# Patient Record
Sex: Male | Born: 1975
Health system: Southern US, Community
[De-identification: ages and names within clinical notes are randomized; demographics above are authoritative.]

## PROBLEM LIST (undated history)

## (undated) DIAGNOSIS — B1910 Unspecified viral hepatitis B without hepatic coma: Secondary | ICD-10-CM

## (undated) HISTORY — PX: NO PAST SURGERIES: SHX2092

## (undated) HISTORY — DX: Unspecified viral hepatitis B without hepatic coma: B19.10

---

## 2001-04-09 ENCOUNTER — Emergency Department (HOSPITAL_COMMUNITY): Admission: EM | Admit: 2001-04-09 | Discharge: 2001-04-09 | Payer: Self-pay | Admitting: Emergency Medicine

## 2003-12-25 ENCOUNTER — Emergency Department (HOSPITAL_COMMUNITY): Admission: EM | Admit: 2003-12-25 | Discharge: 2003-12-25 | Payer: Self-pay | Admitting: Emergency Medicine

## 2006-09-30 ENCOUNTER — Emergency Department (HOSPITAL_COMMUNITY): Admission: EM | Admit: 2006-09-30 | Discharge: 2006-09-30 | Payer: Self-pay | Admitting: Emergency Medicine

## 2009-06-04 ENCOUNTER — Emergency Department (HOSPITAL_COMMUNITY): Admission: EM | Admit: 2009-06-04 | Discharge: 2009-06-04 | Payer: Self-pay | Admitting: Emergency Medicine

## 2009-06-06 ENCOUNTER — Emergency Department (HOSPITAL_COMMUNITY): Admission: EM | Admit: 2009-06-06 | Discharge: 2009-06-06 | Payer: Self-pay | Admitting: Emergency Medicine

## 2009-08-02 ENCOUNTER — Emergency Department (HOSPITAL_COMMUNITY): Admission: EM | Admit: 2009-08-02 | Discharge: 2009-08-02 | Payer: Self-pay | Admitting: Emergency Medicine

## 2009-08-18 ENCOUNTER — Ambulatory Visit: Payer: Self-pay | Admitting: Vascular Surgery

## 2009-08-18 ENCOUNTER — Encounter (INDEPENDENT_AMBULATORY_CARE_PROVIDER_SITE_OTHER): Payer: Self-pay | Admitting: Emergency Medicine

## 2009-08-18 ENCOUNTER — Emergency Department (HOSPITAL_COMMUNITY): Admission: EM | Admit: 2009-08-18 | Discharge: 2009-08-18 | Payer: Self-pay | Admitting: Family Medicine

## 2009-08-18 ENCOUNTER — Inpatient Hospital Stay (HOSPITAL_COMMUNITY): Admission: EM | Admit: 2009-08-18 | Discharge: 2009-08-25 | Payer: Self-pay | Admitting: Emergency Medicine

## 2009-08-18 ENCOUNTER — Encounter (INDEPENDENT_AMBULATORY_CARE_PROVIDER_SITE_OTHER): Payer: Self-pay | Admitting: Internal Medicine

## 2009-08-18 DIAGNOSIS — L03119 Cellulitis of unspecified part of limb: Secondary | ICD-10-CM

## 2009-08-18 DIAGNOSIS — L02419 Cutaneous abscess of limb, unspecified: Secondary | ICD-10-CM | POA: Insufficient documentation

## 2009-08-18 DIAGNOSIS — I82409 Acute embolism and thrombosis of unspecified deep veins of unspecified lower extremity: Secondary | ICD-10-CM | POA: Insufficient documentation

## 2009-08-23 ENCOUNTER — Ambulatory Visit: Payer: Self-pay | Admitting: Infectious Diseases

## 2009-08-25 DIAGNOSIS — D72829 Elevated white blood cell count, unspecified: Secondary | ICD-10-CM | POA: Insufficient documentation

## 2009-08-25 DIAGNOSIS — R7401 Elevation of levels of liver transaminase levels: Secondary | ICD-10-CM | POA: Insufficient documentation

## 2009-08-25 DIAGNOSIS — R740 Nonspecific elevation of levels of transaminase and lactic acid dehydrogenase [LDH]: Secondary | ICD-10-CM

## 2009-08-29 ENCOUNTER — Encounter: Payer: Self-pay | Admitting: Infectious Diseases

## 2009-09-03 ENCOUNTER — Encounter (INDEPENDENT_AMBULATORY_CARE_PROVIDER_SITE_OTHER): Payer: Self-pay | Admitting: *Deleted

## 2009-09-03 DIAGNOSIS — D353 Benign neoplasm of craniopharyngeal duct: Secondary | ICD-10-CM

## 2009-09-03 DIAGNOSIS — D352 Benign neoplasm of pituitary gland: Secondary | ICD-10-CM | POA: Insufficient documentation

## 2009-09-12 ENCOUNTER — Encounter: Payer: Self-pay | Admitting: Infectious Diseases

## 2010-06-17 NOTE — Consult Note (Signed)
Summary: [facility]  [facility]   Imported By: Florinda Marker 09/27/2009 16:59:25  _____________________________________________________________________  External Attachment:    Type:   Image     Comment:   External Document

## 2010-06-17 NOTE — Consult Note (Signed)
Summary: [facility]  [facility]   Imported By: Florinda Marker 09/24/2009 11:14:53  _____________________________________________________________________  External Attachment:    Type:   Image     Comment:   External Document

## 2010-06-17 NOTE — Miscellaneous (Signed)
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

## 2010-08-06 LAB — COMPREHENSIVE METABOLIC PANEL
ALT: 111 U/L — ABNORMAL HIGH (ref 0–53)
ALT: 142 U/L — ABNORMAL HIGH (ref 0–53)
ALT: 166 U/L — ABNORMAL HIGH (ref 0–53)
ALT: 181 U/L — ABNORMAL HIGH (ref 0–53)
ALT: 38 U/L (ref 0–53)
AST: 112 U/L — ABNORMAL HIGH (ref 0–37)
AST: 116 U/L — ABNORMAL HIGH (ref 0–37)
AST: 27 U/L (ref 0–37)
AST: 84 U/L — ABNORMAL HIGH (ref 0–37)
AST: 97 U/L — ABNORMAL HIGH (ref 0–37)
Albumin: 3 g/dL — ABNORMAL LOW (ref 3.5–5.2)
Albumin: 3.3 g/dL — ABNORMAL LOW (ref 3.5–5.2)
Albumin: 3.4 g/dL — ABNORMAL LOW (ref 3.5–5.2)
Albumin: 3.6 g/dL (ref 3.5–5.2)
Albumin: 3.6 g/dL (ref 3.5–5.2)
Alkaline Phosphatase: 110 U/L (ref 39–117)
Alkaline Phosphatase: 86 U/L (ref 39–117)
Alkaline Phosphatase: 88 U/L (ref 39–117)
Alkaline Phosphatase: 93 U/L (ref 39–117)
Alkaline Phosphatase: 96 U/L (ref 39–117)
BUN: 10 mg/dL (ref 6–23)
BUN: 10 mg/dL (ref 6–23)
BUN: 8 mg/dL (ref 6–23)
BUN: 9 mg/dL (ref 6–23)
BUN: 9 mg/dL (ref 6–23)
CO2: 29 mEq/L (ref 19–32)
CO2: 29 mEq/L (ref 19–32)
CO2: 30 mEq/L (ref 19–32)
CO2: 31 mEq/L (ref 19–32)
CO2: 31 mEq/L (ref 19–32)
Calcium: 9.2 mg/dL (ref 8.4–10.5)
Calcium: 9.2 mg/dL (ref 8.4–10.5)
Calcium: 9.2 mg/dL (ref 8.4–10.5)
Calcium: 9.3 mg/dL (ref 8.4–10.5)
Calcium: 9.4 mg/dL (ref 8.4–10.5)
Chloride: 100 mEq/L (ref 96–112)
Chloride: 102 mEq/L (ref 96–112)
Chloride: 103 mEq/L (ref 96–112)
Chloride: 103 mEq/L (ref 96–112)
Chloride: 105 mEq/L (ref 96–112)
Creatinine, Ser: 0.97 mg/dL (ref 0.4–1.5)
Creatinine, Ser: 0.97 mg/dL (ref 0.4–1.5)
Creatinine, Ser: 1.04 mg/dL (ref 0.4–1.5)
Creatinine, Ser: 1.04 mg/dL (ref 0.4–1.5)
Creatinine, Ser: 1.09 mg/dL (ref 0.4–1.5)
GFR calc Af Amer: >60 mL/min (ref >60)
GFR calc Af Amer: >60 mL/min (ref >60)
GFR calc Af Amer: >60 mL/min (ref >60)
GFR calc Af Amer: >60 mL/min (ref >60)
GFR calc Af Amer: >60 mL/min (ref >60)
GFR calc non Af Amer: >60 mL/min (ref >60)
GFR calc non Af Amer: >60 mL/min (ref >60)
GFR calc non Af Amer: >60 mL/min (ref >60)
GFR calc non Af Amer: >60 mL/min (ref >60)
GFR calc non Af Amer: >60 mL/min (ref >60)
Glucose, Bld: 136 mg/dL — ABNORMAL HIGH (ref 70–99)
Glucose, Bld: 85 mg/dL (ref 70–99)
Glucose, Bld: 85 mg/dL (ref 70–99)
Glucose, Bld: 86 mg/dL (ref 70–99)
Glucose, Bld: 88 mg/dL (ref 70–99)
Potassium: 3.9 mEq/L (ref 3.5–5.1)
Potassium: 4.4 mEq/L (ref 3.5–5.1)
Potassium: 4.5 mEq/L (ref 3.5–5.1)
Potassium: 4.6 mEq/L (ref 3.5–5.1)
Potassium: 5.1 mEq/L (ref 3.5–5.1)
Sodium: 135 mEq/L (ref 135–145)
Sodium: 137 mEq/L (ref 135–145)
Sodium: 138 mEq/L (ref 135–145)
Sodium: 139 mEq/L (ref 135–145)
Sodium: 140 mEq/L (ref 135–145)
Total Bilirubin: 0.3 mg/dL (ref 0.3–1.2)
Total Bilirubin: 0.4 mg/dL (ref 0.3–1.2)
Total Bilirubin: 0.4 mg/dL (ref 0.3–1.2)
Total Bilirubin: 0.5 mg/dL (ref 0.3–1.2)
Total Bilirubin: 0.7 mg/dL (ref 0.3–1.2)
Total Protein: 7 g/dL (ref 6.0–8.3)
Total Protein: 7.2 g/dL (ref 6.0–8.3)
Total Protein: 7.3 g/dL (ref 6.0–8.3)
Total Protein: 7.3 g/dL (ref 6.0–8.3)
Total Protein: 7.5 g/dL (ref 6.0–8.3)

## 2010-08-06 LAB — BODY FLUID CULTURE: Culture: NO GROWTH

## 2010-08-06 LAB — BASIC METABOLIC PANEL
BUN: 9 mg/dL (ref 6–23)
BUN: 9 mg/dL (ref 6–23)
CO2: 30 mEq/L (ref 19–32)
CO2: 32 mEq/L (ref 19–32)
Calcium: 9.2 mg/dL (ref 8.4–10.5)
Calcium: 9.3 mg/dL (ref 8.4–10.5)
Chloride: 101 mEq/L (ref 96–112)
Chloride: 102 mEq/L (ref 96–112)
Creatinine, Ser: 0.96 mg/dL (ref 0.4–1.5)
Creatinine, Ser: 1.03 mg/dL (ref 0.4–1.5)
GFR calc Af Amer: >60 mL/min (ref >60)
GFR calc Af Amer: >60 mL/min (ref >60)
GFR calc non Af Amer: >60 mL/min (ref >60)
GFR calc non Af Amer: >60 mL/min (ref >60)
Glucose, Bld: 80 mg/dL (ref 70–99)
Glucose, Bld: 95 mg/dL (ref 70–99)
Potassium: 4.4 mEq/L (ref 3.5–5.1)
Potassium: 4.5 mEq/L (ref 3.5–5.1)
Sodium: 139 mEq/L (ref 135–145)
Sodium: 141 mEq/L (ref 135–145)

## 2010-08-06 LAB — CBC
HCT: 40.1 % (ref 39.0–52.0)
HCT: 40.4 % (ref 39.0–52.0)
HCT: 41.1 % (ref 39.0–52.0)
HCT: 42.7 % (ref 39.0–52.0)
HCT: 42.7 % (ref 39.0–52.0)
HCT: 42.9 % (ref 39.0–52.0)
HCT: 43.3 % (ref 39.0–52.0)
HCT: 43.4 % (ref 39.0–52.0)
Hemoglobin: 13.3 g/dL (ref 13.0–17.0)
Hemoglobin: 13.4 g/dL (ref 13.0–17.0)
Hemoglobin: 13.4 g/dL (ref 13.0–17.0)
Hemoglobin: 14.1 g/dL (ref 13.0–17.0)
Hemoglobin: 14.2 g/dL (ref 13.0–17.0)
Hemoglobin: 14.2 g/dL (ref 13.0–17.0)
Hemoglobin: 14.3 g/dL (ref 13.0–17.0)
Hemoglobin: 14.5 g/dL (ref 13.0–17.0)
MCHC: 32.6 g/dL (ref 30.0–36.0)
MCHC: 32.8 g/dL (ref 30.0–36.0)
MCHC: 33 g/dL (ref 30.0–36.0)
MCHC: 33.1 g/dL (ref 30.0–36.0)
MCHC: 33.2 g/dL (ref 30.0–36.0)
MCHC: 33.3 g/dL (ref 30.0–36.0)
MCHC: 33.4 g/dL (ref 30.0–36.0)
MCHC: 33.6 g/dL (ref 30.0–36.0)
MCV: 79.6 fL (ref 78.0–100.0)
MCV: 79.8 fL (ref 78.0–100.0)
MCV: 79.8 fL (ref 78.0–100.0)
MCV: 79.9 fL (ref 78.0–100.0)
MCV: 79.9 fL (ref 78.0–100.0)
MCV: 80.1 fL (ref 78.0–100.0)
MCV: 80.2 fL (ref 78.0–100.0)
MCV: 80.2 fL (ref 78.0–100.0)
Platelets: 258 10*3/uL (ref 150–400)
Platelets: 267 10*3/uL (ref 150–400)
Platelets: 272 10*3/uL (ref 150–400)
Platelets: 284 10*3/uL (ref 150–400)
Platelets: 287 10*3/uL (ref 150–400)
Platelets: 299 10*3/uL (ref 150–400)
Platelets: 300 10*3/uL (ref 150–400)
Platelets: 300 10*3/uL (ref 150–400)
RBC: 5.02 MIL/uL (ref 4.22–5.81)
RBC: 5.06 MIL/uL (ref 4.22–5.81)
RBC: 5.12 MIL/uL (ref 4.22–5.81)
RBC: 5.32 MIL/uL (ref 4.22–5.81)
RBC: 5.33 MIL/uL (ref 4.22–5.81)
RBC: 5.37 MIL/uL (ref 4.22–5.81)
RBC: 5.42 MIL/uL (ref 4.22–5.81)
RBC: 5.45 MIL/uL (ref 4.22–5.81)
RDW: 12.5 % (ref 11.5–15.5)
RDW: 12.6 % (ref 11.5–15.5)
RDW: 12.6 % (ref 11.5–15.5)
RDW: 12.7 % (ref 11.5–15.5)
RDW: 12.7 % (ref 11.5–15.5)
RDW: 12.8 % (ref 11.5–15.5)
RDW: 12.8 % (ref 11.5–15.5)
RDW: 12.9 % (ref 11.5–15.5)
WBC: 10.8 10*3/uL — ABNORMAL HIGH (ref 4.0–10.5)
WBC: 11.4 10*3/uL — ABNORMAL HIGH (ref 4.0–10.5)
WBC: 13 10*3/uL — ABNORMAL HIGH (ref 4.0–10.5)
WBC: 13.1 10*3/uL — ABNORMAL HIGH (ref 4.0–10.5)
WBC: 13.6 10*3/uL — ABNORMAL HIGH (ref 4.0–10.5)
WBC: 15.6 10*3/uL — ABNORMAL HIGH (ref 4.0–10.5)
WBC: 19 10*3/uL — ABNORMAL HIGH (ref 4.0–10.5)
WBC: 20.3 10*3/uL — ABNORMAL HIGH (ref 4.0–10.5)

## 2010-08-06 LAB — SYNOVIAL CELL COUNT + DIFF, W/ CRYSTALS
Lymphocytes-Synovial Fld: 40 % — ABNORMAL HIGH (ref 0–20)
Monocyte-Macrophage-Synovial Fluid: 34 % — ABNORMAL LOW (ref 50–90)
Neutrophil, Synovial: 26 % — ABNORMAL HIGH (ref 0–25)
WBC, Synovial: 129 /mm3 (ref 0–200)

## 2010-08-06 LAB — BETA-2-GLYCOPROTEIN I ABS, IGG/M/A
Beta-2 Glyco I IgG: 1 G Units (ref <20)
Beta-2-Glycoprotein I IgA: 1 A Units (ref <20)
Beta-2-Glycoprotein I IgM: 2 M Units (ref <20)

## 2010-08-06 LAB — CULTURE, ROUTINE-ABSCESS

## 2010-08-06 LAB — CARDIOLIPIN ANTIBODIES, IGG, IGM, IGA
Anticardiolipin IgA: 2 APL U/mL — ABNORMAL LOW (ref <22)
Anticardiolipin IgG: 5 GPL U/mL — ABNORMAL LOW (ref <23)
Anticardiolipin IgM: 0 MPL U/mL — ABNORMAL LOW (ref <11)

## 2010-08-06 LAB — GRAM STAIN

## 2010-08-06 LAB — PROTIME-INR
INR: 1.05 (ref 0.00–1.49)
INR: 1.07 (ref 0.00–1.49)
INR: 1.19 (ref 0.00–1.49)
INR: 1.24 (ref 0.00–1.49)
INR: 1.54 — ABNORMAL HIGH (ref 0.00–1.49)
INR: 1.89 — ABNORMAL HIGH (ref 0.00–1.49)
INR: 2.36 — ABNORMAL HIGH (ref 0.00–1.49)
Prothrombin Time: 13.6 seconds (ref 11.6–15.2)
Prothrombin Time: 13.8 seconds (ref 11.6–15.2)
Prothrombin Time: 15 seconds (ref 11.6–15.2)
Prothrombin Time: 15.5 seconds — ABNORMAL HIGH (ref 11.6–15.2)
Prothrombin Time: 18.4 seconds — ABNORMAL HIGH (ref 11.6–15.2)
Prothrombin Time: 21.5 seconds — ABNORMAL HIGH (ref 11.6–15.2)
Prothrombin Time: 25.6 seconds — ABNORMAL HIGH (ref 11.6–15.2)

## 2010-08-06 LAB — ANAEROBIC CULTURE

## 2010-08-06 LAB — DIFFERENTIAL
Basophils Absolute: 0 10*3/uL (ref 0.0–0.1)
Basophils Relative: 0 % (ref 0–1)
Eosinophils Absolute: 0.2 10*3/uL (ref 0.0–0.7)
Eosinophils Relative: 1 % (ref 0–5)
Lymphocytes Relative: 9 % — ABNORMAL LOW (ref 12–46)
Lymphs Abs: 1.4 10*3/uL (ref 0.7–4.0)
Monocytes Absolute: 1.2 10*3/uL — ABNORMAL HIGH (ref 0.1–1.0)
Monocytes Relative: 8 % (ref 3–12)
Neutro Abs: 12.8 10*3/uL — ABNORMAL HIGH (ref 1.7–7.7)
Neutrophils Relative %: 82 % — ABNORMAL HIGH (ref 43–77)

## 2010-08-06 LAB — LUPUS ANTICOAGULANT PANEL
DRVVT: 46.5 secs — ABNORMAL HIGH (ref 36.2–44.3)
Lupus Anticoagulant: NOT DETECTED
PTT Lupus Anticoagulant: 46.3 secs — ABNORMAL HIGH (ref 32.0–43.4)
PTTLA 4:1 Mix: 43.8 secs (ref 36.3–48.8)
PTTLA Confirmation: 7.8 secs (ref <8.0)
dRVVT Incubated 1:1 Mix: 40.8 secs (ref 36.2–44.3)

## 2010-08-06 LAB — CULTURE, BLOOD (ROUTINE X 2)
Culture: NO GROWTH
Culture: NO GROWTH

## 2010-08-06 LAB — MAGNESIUM: Magnesium: 2.3 mg/dL (ref 1.5–2.5)

## 2010-08-06 LAB — POCT I-STAT, CHEM 8
BUN: 11 mg/dL (ref 6–23)
Calcium, Ion: 1.16 mmol/L (ref 1.12–1.32)
Chloride: 102 mEq/L (ref 96–112)
Creatinine, Ser: 1.1 mg/dL (ref 0.4–1.5)
Glucose, Bld: 93 mg/dL (ref 70–99)
HCT: 44 % (ref 39.0–52.0)
Hemoglobin: 15 g/dL (ref 13.0–17.0)
Potassium: 3.6 mEq/L (ref 3.5–5.1)
Sodium: 139 mEq/L (ref 135–145)
TCO2: 31 mmol/L (ref 0–100)

## 2010-08-06 LAB — PROTEIN C ACTIVITY: Protein C Activity: 147 % — ABNORMAL HIGH (ref 75–133)

## 2010-08-06 LAB — HEMOGLOBIN A1C
Hgb A1c MFr Bld: 5.8 % (ref 4.6–6.1)
Mean Plasma Glucose: 120 mg/dL

## 2010-08-06 LAB — CK: Total CK: 121 U/L (ref 7–232)

## 2010-08-06 LAB — HIV ANTIBODY (ROUTINE TESTING W REFLEX): HIV: NONREACTIVE

## 2010-08-06 LAB — C-REACTIVE PROTEIN: CRP: 2 mg/dL — ABNORMAL HIGH (ref <0.6)

## 2010-08-06 LAB — HOMOCYSTEINE: Homocysteine: 8.4 umol/L (ref 4.0–15.4)

## 2010-08-06 LAB — HEPATITIS PANEL, ACUTE
HCV Ab: NEGATIVE
Hep A IgM: NEGATIVE
Hep B C IgM: NEGATIVE
Hepatitis B Surface Ag: POSITIVE — AB

## 2010-08-06 LAB — ANTITHROMBIN III: AntiThromb III Func: 200 % — ABNORMAL HIGH (ref 76–126)

## 2010-08-06 LAB — PROTHROMBIN GENE MUTATION

## 2010-08-06 LAB — SEDIMENTATION RATE
Sed Rate: 21 mm/hr — ABNORMAL HIGH (ref 0–16)
Sed Rate: 57 mm/hr — ABNORMAL HIGH (ref 0–16)

## 2010-08-06 LAB — PROTEIN C, TOTAL: Protein C, Total: 119 % (ref 70–140)

## 2010-08-06 LAB — PROTEIN S, TOTAL: Protein S Ag, Total: 128 % (ref 70–140)

## 2010-08-06 LAB — FACTOR 5 LEIDEN

## 2010-08-06 LAB — PROTEIN S ACTIVITY: Protein S Activity: 67 % — ABNORMAL LOW (ref 69–129)

## 2011-10-08 ENCOUNTER — Encounter: Payer: Self-pay | Admitting: Internal Medicine

## 2011-10-08 ENCOUNTER — Ambulatory Visit (INDEPENDENT_AMBULATORY_CARE_PROVIDER_SITE_OTHER): Payer: Self-pay | Admitting: Internal Medicine

## 2011-10-08 ENCOUNTER — Encounter: Payer: Self-pay | Admitting: *Deleted

## 2011-10-08 ENCOUNTER — Ambulatory Visit: Payer: Self-pay | Admitting: Internal Medicine

## 2011-10-08 VITALS — BP 127/81 | HR 71 | Temp 98.2°F | Ht 72.0 in | Wt 180.8 lb

## 2011-10-08 DIAGNOSIS — B1910 Unspecified viral hepatitis B without hepatic coma: Secondary | ICD-10-CM

## 2011-10-08 DIAGNOSIS — B180 Chronic viral hepatitis B with delta-agent: Secondary | ICD-10-CM | POA: Insufficient documentation

## 2011-10-08 LAB — COMPREHENSIVE METABOLIC PANEL
ALT: 21 U/L (ref 0–53)
AST: 21 U/L (ref 0–37)
Albumin: 4.7 g/dL (ref 3.5–5.2)
Alkaline Phosphatase: 57 U/L (ref 39–117)
BUN: 12 mg/dL (ref 6–23)
CO2: 26 mEq/L (ref 19–32)
Calcium: 9.4 mg/dL (ref 8.4–10.5)
Chloride: 105 mEq/L (ref 96–112)
Creat: 1.08 mg/dL (ref 0.50–1.35)
Glucose, Bld: 81 mg/dL (ref 70–99)
Potassium: 4 mEq/L (ref 3.5–5.3)
Sodium: 139 mEq/L (ref 135–145)
Total Bilirubin: 0.4 mg/dL (ref 0.3–1.2)
Total Protein: 7 g/dL (ref 6.0–8.3)

## 2011-10-08 NOTE — Progress Notes (Signed)
  Subjective:    Patient ID: Jesse Moreno, male    DOB: 1976/01/13, 36 y.o.   MRN: [id number]  HPI This patient comes in as a new patient for evaluation of chronic hepatitis B. He was born in [place] and is currently living here in [place] and employed at [employer]. He had a needle stick exposure and evaluation was significant for positive hepatitis B surface antigen.  Further workup revealed a hepatitis b viral DNA level of 467 international units per mL, a negative HIV, a negative hepatitis C, a positive hepatitis B core IgG and the negative IgM. He has had no symptoms including no abdominal pain, no the high-risk exposures.   Review of Systems  Constitutional: Negative.   Respiratory: Negative.   Cardiovascular: Negative.   Gastrointestinal: Negative.   Musculoskeletal: Negative.   Skin: Negative.   Hematological: Negative.        Objective:   Physical Exam  Constitutional: He appears well-developed and well-nourished. No distress.  Cardiovascular: Normal rate, regular rhythm and normal heart sounds.  Exam reveals no gallop and no friction rub.   No murmur heard. Abdominal: Soft. Bowel sounds are normal. He exhibits no distension. There is no tenderness. There is no rebound.       No hepatosplenomegaly  Lymphadenopathy:    He has no cervical adenopathy.  Skin: Skin is warm and dry. No rash noted.          Assessment & Plan:

## 2011-10-08 NOTE — Assessment & Plan Note (Addendum)
I will complete the workup now with a hepatitis b E. Antigen and repeat the viral load as well as checking on ALT and AST. I suspect with a low viral load he will not qualify for the need for treatment. I did discuss with him the long-term effects of active hepatitis B including cirrhosis and hepatocellular carcinoma. I did discuss that these risks are up if he does have significant inflammation. I will check this today and he will return in about 2 weeks to over the final results and come up with the plan of periodic surveillance versus less likely plan of needing treatment. I also will check a hepatitis A antibody to see if he needs vaccination

## 2011-10-09 LAB — HEPATITIS A ANTIBODY, TOTAL: Hep A Total Ab: POSITIVE — AB

## 2011-10-09 LAB — HEPATITIS B SURFACE ANTIGEN: Hepatitis B Surface Ag: POSITIVE — AB

## 2011-10-13 LAB — HEPATITIS B E ANTIBODY: Hepatitis Be Antibody: POSITIVE — AB

## 2011-10-13 LAB — HEPATITIS B E ANTIGEN: Hepatitis Be Antigen: NEGATIVE

## 2011-10-14 LAB — HEPATITIS B DNA, ULTRAQUANTITATIVE, PCR
Hepatitis B DNA (Calc): 2485 copies/mL — ABNORMAL HIGH (ref <116)
Hepatitis B DNA: 427 IU/mL — ABNORMAL HIGH (ref <20)

## 2011-10-21 ENCOUNTER — Telehealth: Payer: Self-pay | Admitting: *Deleted

## 2011-10-21 NOTE — Telephone Encounter (Signed)
Reminder message left on cell phone #.

## 2011-10-22 ENCOUNTER — Encounter: Payer: Self-pay | Admitting: Internal Medicine

## 2011-10-22 ENCOUNTER — Ambulatory Visit (INDEPENDENT_AMBULATORY_CARE_PROVIDER_SITE_OTHER): Payer: 59 | Admitting: Internal Medicine

## 2011-10-22 VITALS — BP 112/73 | HR 67 | Temp 97.7°F | Ht 72.0 in | Wt 184.8 lb

## 2011-10-22 DIAGNOSIS — B1910 Unspecified viral hepatitis B without hepatic coma: Secondary | ICD-10-CM

## 2011-10-22 NOTE — Progress Notes (Signed)
  Subjective:    Patient ID: Jesse Moreno, male    DOB: 1975/12/30, 36 y.o.   MRN: [id number]  HPI He comes in for followup of his hepatitis B. He was diagnosed with active hepatitis B by occupational health after an exposure. He works as a Stage manager. He had a notable viral load though was less than 500 international units as well as a positive hepatitis B surface antigen. He has a negative hepatitis C antibody, negative HIV and a positive hepatitis a total antibody. He comes in for followup of his first visit after the labs were drawn. He is hepatitis E. Antigen negative and antibody positive. His AST and ALT are within normal limits. He did have an increase of his AST and ALT 2 years ago that he attributes to medication he was taken at that time. He otherwise feels well.   Review of Systems  Constitutional: Negative for fever, chills and unexpected weight change.  Gastrointestinal: Negative for nausea, abdominal pain, diarrhea and constipation.  Musculoskeletal: Negative for myalgias, joint swelling and arthralgias.  Skin: Negative for rash.  Psychiatric/Behavioral: Negative for dysphoric mood. The patient is not nervous/anxious.        Objective:   Physical Exam  Constitutional: He appears well-developed and well-nourished. No distress.  Abdominal: Soft. Bowel sounds are normal. He exhibits no distension. There is no tenderness. There is no rebound.  Skin: No rash noted.          Assessment & Plan:

## 2011-10-22 NOTE — Assessment & Plan Note (Signed)
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 This will require periodic followup with LFTs and if there is any increase in his AST or ALT, viral load and further workup may be indicated. With a low viral load though there is no indication for treatment. He was told this and will continue to followup and will return to clinic in 6 months.

## 2012-04-12 ENCOUNTER — Other Ambulatory Visit: Payer: 59

## 2012-04-26 ENCOUNTER — Telehealth: Payer: Self-pay | Admitting: *Deleted

## 2012-04-26 ENCOUNTER — Ambulatory Visit: Payer: 59 | Admitting: Internal Medicine

## 2012-04-26 NOTE — Telephone Encounter (Signed)
Called patient to reschedule appt, he no showed today, unable to leave voice mail. Wendall Mola CMA

## 2017-11-15 ENCOUNTER — Ambulatory Visit: Payer: 59 | Admitting: Medical

## 2017-11-15 ENCOUNTER — Encounter: Payer: Self-pay | Admitting: Medical

## 2017-11-15 VITALS — BP 132/69 | HR 78 | Temp 98.3°F | Resp 16 | Ht 73.0 in | Wt 183.0 lb

## 2017-11-15 DIAGNOSIS — Z23 Encounter for immunization: Secondary | ICD-10-CM | POA: Diagnosis not present

## 2017-11-15 DIAGNOSIS — Z0000 Encounter for general adult medical examination without abnormal findings: Secondary | ICD-10-CM | POA: Diagnosis not present

## 2017-11-15 LAB — LIPID PANEL
Cholesterol: 168 mg/dL (ref 0–200)
HDL: 40.6 mg/dL (ref >39.00)
LDL Cholesterol: 109 mg/dL — ABNORMAL HIGH (ref 0–99)
NonHDL: 127.52
Total CHOL/HDL Ratio: 4
Triglycerides: 92 mg/dL (ref 0.0–149.0)
VLDL: 18.4 mg/dL (ref 0.0–40.0)

## 2017-11-15 LAB — POC URINALSYSI DIPSTICK (AUTOMATED)
Bilirubin, UA: NEGATIVE
Blood, UA: NEGATIVE
Glucose, UA: NEGATIVE
Ketones, UA: NEGATIVE
Leukocytes, UA: NEGATIVE
Nitrite, UA: NEGATIVE
Protein, UA: NEGATIVE
Spec Grav, UA: 1.015 (ref 1.010–1.025)
Urobilinogen, UA: 0.2 E.U./dL
pH, UA: 6.5 (ref 5.0–8.0)

## 2017-11-15 LAB — CBC WITH DIFFERENTIAL/PLATELET
Basophils Absolute: 0 10*3/uL (ref 0.0–0.1)
Basophils Relative: 0.9 % (ref 0.0–3.0)
Eosinophils Absolute: 0.1 10*3/uL (ref 0.0–0.7)
Eosinophils Relative: 2.1 % (ref 0.0–5.0)
HCT: 44.5 % (ref 39.0–52.0)
Hemoglobin: 15.1 g/dL (ref 13.0–17.0)
Lymphocytes Relative: 43.2 % (ref 12.0–46.0)
Lymphs Abs: 2.3 10*3/uL (ref 0.7–4.0)
MCHC: 33.9 g/dL (ref 30.0–36.0)
MCV: 78.1 fl (ref 78.0–100.0)
Monocytes Absolute: 0.7 10*3/uL (ref 0.1–1.0)
Monocytes Relative: 12.5 % — ABNORMAL HIGH (ref 3.0–12.0)
Neutro Abs: 2.2 10*3/uL (ref 1.4–7.7)
Neutrophils Relative %: 41.3 % — ABNORMAL LOW (ref 43.0–77.0)
Platelets: 217 10*3/uL (ref 150.0–400.0)
RBC: 5.7 Mil/uL (ref 4.22–5.81)
RDW: 13.2 % (ref 11.5–15.5)
WBC: 5.3 10*3/uL (ref 4.0–10.5)

## 2017-11-15 LAB — COMPREHENSIVE METABOLIC PANEL
ALT: 26 U/L (ref 0–53)
AST: 22 U/L (ref 0–37)
Albumin: 4.6 g/dL (ref 3.5–5.2)
Alkaline Phosphatase: 63 U/L (ref 39–117)
BUN: 13 mg/dL (ref 6–23)
CO2: 32 mEq/L (ref 19–32)
Calcium: 9.5 mg/dL (ref 8.4–10.5)
Chloride: 104 mEq/L (ref 96–112)
Creatinine, Ser: 1.08 mg/dL (ref 0.40–1.50)
GFR: 97.29 mL/min (ref >60.00)
Glucose, Bld: 74 mg/dL (ref 70–99)
Potassium: 3.5 mEq/L (ref 3.5–5.1)
Sodium: 141 mEq/L (ref 135–145)
Total Bilirubin: 0.3 mg/dL (ref 0.2–1.2)
Total Protein: 6.8 g/dL (ref 6.0–8.3)

## 2017-11-15 NOTE — Progress Notes (Signed)
Subjective:    Patient ID: Jesse Moreno, male    DOB: [birth year], 42 y.o.   MRN: [id number]  HPI  Pt in for first time. He wants CPE.  Pt is due for tdap.  Up to date on hiv screen.  Pt exercises regularly. He jogs 3-4 miles about 3-4 times a week. He states he eats healthy. No caffeinated beverages. Marriage. 3 children.   Review of Systems  Constitutional: Negative for chills, fatigue and fever.  HENT: Negative for congestion and drooling.   Respiratory: Negative for chest tightness, shortness of breath and wheezing.   Cardiovascular: Negative for chest pain and palpitations.  Genitourinary: Negative for decreased urine volume, dysuria, flank pain, frequency and testicular pain.  Musculoskeletal: Negative for back pain and neck pain.  Skin: Negative for rash.  Neurological: Negative for dizziness, seizures, weakness and light-headedness.  Hematological: Negative for adenopathy. Does not bruise/bleed easily.  Psychiatric/Behavioral: Negative for behavioral problems, decreased concentration, dysphoric mood and suicidal ideas. The patient is not hyperactive.     Past Medical History:  Diagnosis Date  . Hepatitis B      Social History   Socioeconomic History  . Marital status: Married    Spouse name: Not on file  . Number of children: Not on file  . Years of education: Not on file  . Highest education level: Not on file  Occupational History  . Not on file  Social Needs  . Financial resource strain: Not on file  . Food insecurity:    Worry: Not on file    Inability: Not on file  . Transportation needs:    Medical: Not on file    Non-medical: Not on file  Tobacco Use  . Smoking status: Never Smoker  . Smokeless tobacco: Never Used  Substance and Sexual Activity  . Alcohol use: No  . Drug use: No  . Sexual activity: Yes    Comment: accepted condoms  Lifestyle  . Physical activity:    Days per week: Not on file    Minutes per session: Not on file  .  Stress: Not on file  Relationships  . Social connections:    Talks on phone: Not on file    Gets together: Not on file    Attends religious service: Not on file    Active member of club or organization: Not on file    Attends meetings of clubs or organizations: Not on file    Relationship status: Not on file  . Intimate partner violence:    Fear of current or ex partner: Not on file    Emotionally abused: Not on file    Physically abused: Not on file    Forced sexual activity: Not on file  Other Topics Concern  . Not on file  Social History Narrative  . Not on file    No past surgical history on file.  Family History  Problem Relation Age of Onset  . Diabetes Mother   . Hypertension Mother   . Diabetes Father     Allergies  Allergen Reactions  . Ampicillin   . Aspirin   . Vancomycin     No current outpatient medications on file prior to visit.   No current facility-administered medications on file prior to visit.     BP 132/69   Pulse 78   Temp 98.3 F (36.8 C) (Oral)   Resp 16   Ht 6\' 1"  (1.854 m)   Wt 183 lb (83 kg)  SpO2 100%   BMI 24.14 kg/m       Objective:   Physical Exam   General Mental Status- Alert. General Appearance- Not in acute distress.   Skin General: Color- Normal Color. Moisture- Normal Moisture.  Neck Carotid Arteries- Normal color. Moisture- Normal Moisture. No carotid bruits. No JVD.  Chest and Lung Exam Auscultation: Breath Sounds:-Normal.  Cardiovascular Auscultation:Rythm- Regular. Murmurs & Other Heart Sounds:Auscultation of the heart reveals- No Murmurs.  Abdomen Inspection:-Inspeection Normal. Palpation/Percussion:Note:No mass. Palpation and Percussion of the abdomen reveal- Non Tender, Non Distended + BS, no rebound or guarding.   Neurologic Cranial Nerve exam:- CN III-XII intact(No nystagmus), symmetric smile. Strength:- 5/5 equal and symmetric strength both upper and lower extremities.     Assessment &  Plan:  For you wellness exam today I have ordered cbc, cmp, lipid panel, and ua.  Vaccine given today tdap.  Recommend exercise and healthy diet.  We will let you know lab results as they come in.  Follow up date appointment will be determined after lab review.   Mackie Pai, PA-C

## 2017-11-15 NOTE — Patient Instructions (Addendum)
For you wellness exam today I have ordered cbc, cmp, lipid panel, and ua.  Vaccine given today tdap.   Recommend exercise and healthy diet.  We will let you know lab results as they come in.  Follow up date appointment will be determined after lab review.     Preventive Care 40-64 Years, Male Preventive care refers to lifestyle choices and visits with your health care provider that can promote health and wellness. What does preventive care include?  A yearly physical exam. This is also called an annual well check.  Dental exams once or twice a year.  Routine eye exams. Ask your health care provider how often you should have your eyes checked.  Personal lifestyle choices, including: ? Daily care of your teeth and gums. ? Regular physical activity. ? Eating a healthy diet. ? Avoiding tobacco and drug use. ? Limiting alcohol use. ? Practicing safe sex. ? Taking low-dose aspirin every day starting at age 50. What happens during an annual well check? The services and screenings done by your health care provider during your annual well check will depend on your age, overall health, lifestyle risk factors, and family history of disease. Counseling Your health care provider may ask you questions about your:  Alcohol use.  Tobacco use.  Drug use.  Emotional well-being.  Home and relationship well-being.  Sexual activity.  Eating habits.  Work and work environment.  Screening You may have the following tests or measurements:  Height, weight, and BMI.  Blood pressure.  Lipid and cholesterol levels. These may be checked every 5 years, or more frequently if you are over 50 years old.  Skin check.  Lung cancer screening. You may have this screening every year starting at age 55 if you have a 30-pack-year history of smoking and currently smoke or have quit within the past 15 years.  Fecal occult blood test (FOBT) of the stool. You may have this test every year  starting at age 50.  Flexible sigmoidoscopy or colonoscopy. You may have a sigmoidoscopy every 5 years or a colonoscopy every 10 years starting at age 50.  Prostate cancer screening. Recommendations will vary depending on your family history and other risks.  Hepatitis C blood test.  Hepatitis B blood test.  Sexually transmitted disease (STD) testing.  Diabetes screening. This is done by checking your blood sugar (glucose) after you have not eaten for a while (fasting). You may have this done every 1-3 years.  Discuss your test results, treatment options, and if necessary, the need for more tests with your health care provider. Vaccines Your health care provider may recommend certain vaccines, such as:  Influenza vaccine. This is recommended every year.  Tetanus, diphtheria, and acellular pertussis (Tdap, Td) vaccine. You may need a Td booster every 10 years.  Varicella vaccine. You may need this if you have not been vaccinated.  Zoster vaccine. You may need this after age 60.  Measles, mumps, and rubella (MMR) vaccine. You may need at least one dose of MMR if you were born in 1957 or later. You may also need a second dose.  Pneumococcal 13-valent conjugate (PCV13) vaccine. You may need this if you have certain conditions and have not been vaccinated.  Pneumococcal polysaccharide (PPSV23) vaccine. You may need one or two doses if you smoke cigarettes or if you have certain conditions.  Meningococcal vaccine. You may need this if you have certain conditions.  Hepatitis A vaccine. You may need this if you have certain   you have certain conditions or if you travel or work in places where you may be exposed to hepatitis A.  Hepatitis B vaccine. You may need this if you have certain conditions or if you travel or work in places where you may be exposed to hepatitis B.  Haemophilus influenzae type b (Hib) vaccine. You may need this if you have certain risk factors.  Talk to your health care provider  about which screenings and vaccines you need and how often you need them. This information is not intended to replace advice given to you by your health care provider. Make sure you discuss any questions you have with your health care provider. Document Released: 05/31/2015 Document Revised: 01/22/2016 Document Reviewed: 03/05/2015 Elsevier Interactive Patient Education  [employer].

## 2018-09-27 ENCOUNTER — Telehealth: Payer: Self-pay | Admitting: Medical

## 2018-09-27 NOTE — Telephone Encounter (Signed)
Per CRM patient wanted to make an appt for CPE  Left msg

## 2018-09-28 ENCOUNTER — Telehealth: Payer: Self-pay | Admitting: Medical

## 2018-09-28 NOTE — Telephone Encounter (Signed)
LVM for pt to call the office to schedule CPE at his convenient time after 11-16-2018 since pt had his cpe done 11-15-2017.

## 2018-11-01 ENCOUNTER — Encounter: Payer: 59 | Admitting: Medical

## 2018-11-12 NOTE — Patient Instructions (Signed)
For you wellness exam today I have ordered cbc, cmp, and lipid panel. Vaccine given today.   Recommend exercise and healthy diet.  We will let you know lab results as they come in.  Follow up date appointment will be determined after lab review.   Will refer pt back to ID for hx of hep B.    Preventive Care 70-41 Years Old, Male Preventive care refers to lifestyle choices and visits with your health care provider that can promote health and wellness. This includes:  A yearly physical exam. This is also called an annual well check.  Regular dental and eye exams.  Immunizations.  Screening for certain conditions.  Healthy lifestyle choices, such as eating a healthy diet, getting regular exercise, not using drugs or products that contain nicotine and tobacco, and limiting alcohol use. What can I expect for my preventive care visit? Physical exam Your health care provider will check:  Height and weight. These may be used to calculate body mass index (BMI), which is a measurement that tells if you are at a healthy weight.  Heart rate and blood pressure.  Your skin for abnormal spots. Counseling Your health care provider may ask you questions about:  Alcohol, tobacco, and drug use.  Emotional well-being.  Home and relationship well-being.  Sexual activity.  Eating habits.  Work and work Statistician. What immunizations do I need?  Influenza (flu) vaccine  This is recommended every year. Tetanus, diphtheria, and pertussis (Tdap) vaccine  You may need a Td booster every 10 years. Varicella (chickenpox) vaccine  You may need this vaccine if you have not already been vaccinated. Zoster (shingles) vaccine  You may need this after age 39. Measles, mumps, and rubella (MMR) vaccine  You may need at least one dose of MMR if you were born in 1957 or later. You may also need a second dose. Pneumococcal conjugate (PCV13) vaccine  You may need this if you have certain  conditions and were not previously vaccinated. Pneumococcal polysaccharide (PPSV23) vaccine  You may need one or two doses if you smoke cigarettes or if you have certain conditions. Meningococcal conjugate (MenACWY) vaccine  You may need this if you have certain conditions. Hepatitis A vaccine  You may need this if you have certain conditions or if you travel or work in places where you may be exposed to hepatitis A. Hepatitis B vaccine  You may need this if you have certain conditions or if you travel or work in places where you may be exposed to hepatitis B. Haemophilus influenzae type b (Hib) vaccine  You may need this if you have certain risk factors. Human papillomavirus (HPV) vaccine  If recommended by your health care provider, you may need three doses over 6 months. You may receive vaccines as individual doses or as more than one vaccine together in one shot (combination vaccines). Talk with your health care provider about the risks and benefits of combination vaccines. What tests do I need? Blood tests  Lipid and cholesterol levels. These may be checked every 5 years, or more frequently if you are over 31 years old.  Hepatitis C test.  Hepatitis B test. Screening  Lung cancer screening. You may have this screening every year starting at age 68 if you have a 30-pack-year history of smoking and currently smoke or have quit within the past 15 years.  Prostate cancer screening. Recommendations will vary depending on your family history and other risks.  Colorectal cancer screening. All adults should  have this screening starting at age 36 and continuing until age 17. Your health care provider may recommend screening at age 40 if you are at increased risk. You will have tests every 1-10 years, depending on your results and the type of screening test.  Diabetes screening. This is done by checking your blood sugar (glucose) after you have not eaten for a while (fasting). You may  have this done every 1-3 years.  Sexually transmitted disease (STD) testing. Follow these instructions at home: Eating and drinking  Eat a diet that includes fresh fruits and vegetables, whole grains, lean protein, and low-fat dairy products.  Take vitamin and mineral supplements as recommended by your health care provider.  Do not drink alcohol if your health care provider tells you not to drink.  If you drink alcohol: ? Limit how much you have to 0-2 drinks a day. ? Be aware of how much alcohol is in your drink. In the [place] one drink equals one 12 oz bottle of beer (355 mL), one 5 oz glass of wine (148 mL), or one 1 oz glass of hard liquor (44 mL). Lifestyle  Take daily care of your teeth and gums.  Stay active. Exercise for at least 30 minutes on 5 or more days each week.  Do not use any products that contain nicotine or tobacco, such as cigarettes, e-cigarettes, and chewing tobacco. If you need help quitting, ask your health care provider.  If you are sexually active, practice safe sex. Use a condom or other form of protection to prevent STIs (sexually transmitted infections).  Talk with your health care provider about taking a low-dose aspirin every day starting at age 32. What's next?  Go to your health care provider once a year for a well check visit.  Ask your health care provider how often you should have your eyes and teeth checked.  Stay up to date on all vaccines. This information is not intended to replace advice given to you by your health care provider. Make sure you discuss any questions you have with your health care provider. Document Released: 05/31/2015 Document Revised: 04/28/2018 Document Reviewed: 04/28/2018 Elsevier Patient Education  2020 [employer].

## 2018-11-12 NOTE — Progress Notes (Signed)
Subjective:    Patient ID: Jesse Moreno, male    DOB: 07/13/1975, 43 y.o.   MRN: [id number]  HPI  Pt in for wellness exam/cpe.  Pt exercises regularly. He jogs 3-4 miles about 3-4 times a week. He states he eats healthy. No caffeinated beverages. Marriage. 3 children.  Hx of hep b and followed by specialist but appears not seen since 2013 per epic chart review. Looks like he missed appointment 6 months after his last 2013 visit.    Review of Systems  Constitutional: Negative for chills, fatigue and fever.  HENT: Negative for congestion, dental problem, ear pain, postnasal drip, sinus pressure and sinus pain.   Eyes: Negative for visual disturbance.  Respiratory: Negative for cough, chest tightness, shortness of breath and wheezing.   Cardiovascular: Negative for chest pain and palpitations.  Gastrointestinal: Negative for abdominal pain, constipation, diarrhea, nausea and vomiting.  Endocrine: Negative for polydipsia, polyphagia and polyuria.  Genitourinary: Negative for dysuria, frequency, genital sores, hematuria, testicular pain and urgency.  Musculoskeletal: Negative for back pain and myalgias.  Skin: Negative for rash.  Neurological: Negative for dizziness, syncope, weakness, light-headedness and headaches.  Psychiatric/Behavioral: Negative for behavioral problems, hallucinations, self-injury, sleep disturbance and suicidal ideas. The patient is not nervous/anxious.      Past Medical History:  Diagnosis Date  . Hepatitis B      Social History   Socioeconomic History  . Marital status: Married    Spouse name: Not on file  . Number of children: Not on file  . Years of education: Not on file  . Highest education level: Not on file  Occupational History  . Not on file  Social Needs  . Financial resource strain: Not on file  . Food insecurity    Worry: Not on file    Inability: Not on file  . Transportation needs    Medical: Not on file    Non-medical: Not on  file  Tobacco Use  . Smoking status: Never Smoker  . Smokeless tobacco: Never Used  Substance and Sexual Activity  . Alcohol use: No  . Drug use: No  . Sexual activity: Yes    Comment: accepted condoms  Lifestyle  . Physical activity    Days per week: Not on file    Minutes per session: Not on file  . Stress: Not on file  Relationships  . Social Herbalist on phone: Not on file    Gets together: Not on file    Attends religious service: Not on file    Active member of club or organization: Not on file    Attends meetings of clubs or organizations: Not on file    Relationship status: Not on file  . Intimate partner violence    Fear of current or ex partner: Not on file    Emotionally abused: Not on file    Physically abused: Not on file    Forced sexual activity: Not on file  Other Topics Concern  . Not on file  Social History Narrative  . Not on file    No past surgical history on file.  Family History  Problem Relation Age of Onset  . Diabetes Mother   . Hypertension Mother   . Diabetes Father   . Hypertension Father     Allergies  Allergen Reactions  . Ampicillin   . Aspirin   . Vancomycin     No current outpatient medications on file prior to visit.  No current facility-administered medications on file prior to visit.     There were no vitals taken for this visit.      Objective:   Physical Exam  General Mental Status- Alert. General Appearance- Not in acute distress.   Skin General: Color- Normal Color. Moisture- Normal Moisture.  Neck Carotid Arteries- Normal color. Moisture- Normal Moisture. No carotid bruits. No JVD.  Chest and Lung Exam Auscultation: Breath Sounds:-Normal.  Cardiovascular Auscultation:Rythm- Regular. Murmurs & Other Heart Sounds:Auscultation of the heart reveals- No Murmurs.  Abdomen Inspection:-Inspeection Normal. Palpation/Percussion:Note:No mass. Palpation and Percussion of the abdomen reveal- Non  Tender, Non Distended + BS, no rebound or guarding.   Neurologic Cranial Nerve exam:- CN III-XII intact(No nystagmus), symmetric smile. Strength:- 5/5 equal and symmetric strength both upper and lower extremities.      Assessment & Plan:  For you wellness exam today I have ordered cbc, cmp, and lipid panel. Vaccine given today.   Recommend exercise and healthy diet.  We will let you know lab results as they come in.  Follow up date appointment will be determined after lab review.   Will refer pt back to ID for hx of hep B.    Mackie Pai, PA-C

## 2018-11-14 ENCOUNTER — Ambulatory Visit (INDEPENDENT_AMBULATORY_CARE_PROVIDER_SITE_OTHER): Payer: 59 | Admitting: Medical

## 2018-11-14 ENCOUNTER — Other Ambulatory Visit: Payer: Self-pay

## 2018-11-14 ENCOUNTER — Encounter: Payer: Self-pay | Admitting: Medical

## 2018-11-14 VITALS — BP 137/76 | HR 65 | Temp 98.1°F | Resp 16 | Ht 73.0 in | Wt 192.6 lb

## 2018-11-14 DIAGNOSIS — B1910 Unspecified viral hepatitis B without hepatic coma: Secondary | ICD-10-CM

## 2018-11-14 DIAGNOSIS — Z0000 Encounter for general adult medical examination without abnormal findings: Secondary | ICD-10-CM

## 2018-11-14 LAB — CBC WITH DIFFERENTIAL/PLATELET
Basophils Absolute: 0.1 10*3/uL (ref 0.0–0.1)
Basophils Relative: 1 % (ref 0.0–3.0)
Eosinophils Absolute: 0.2 10*3/uL (ref 0.0–0.7)
Eosinophils Relative: 3 % (ref 0.0–5.0)
HCT: 46.8 % (ref 39.0–52.0)
Hemoglobin: 15.5 g/dL (ref 13.0–17.0)
Lymphocytes Relative: 44 % (ref 12.0–46.0)
Lymphs Abs: 2.4 10*3/uL (ref 0.7–4.0)
MCHC: 33 g/dL (ref 30.0–36.0)
MCV: 79.9 fl (ref 78.0–100.0)
Monocytes Absolute: 0.6 10*3/uL (ref 0.1–1.0)
Monocytes Relative: 11.1 % (ref 3.0–12.0)
Neutro Abs: 2.2 10*3/uL (ref 1.4–7.7)
Neutrophils Relative %: 40.9 % — ABNORMAL LOW (ref 43.0–77.0)
Platelets: 206 10*3/uL (ref 150.0–400.0)
RBC: 5.86 Mil/uL — ABNORMAL HIGH (ref 4.22–5.81)
RDW: 13.3 % (ref 11.5–15.5)
WBC: 5.4 10*3/uL (ref 4.0–10.5)

## 2018-11-15 LAB — LIPID PANEL
Cholesterol: 148 mg/dL (ref 0–200)
HDL: 40.3 mg/dL (ref >39.00)
LDL Cholesterol: 94 mg/dL (ref 0–99)
NonHDL: 108.07
Total CHOL/HDL Ratio: 4
Triglycerides: 69 mg/dL (ref 0.0–149.0)
VLDL: 13.8 mg/dL (ref 0.0–40.0)

## 2018-11-15 LAB — COMPREHENSIVE METABOLIC PANEL
ALT: 21 U/L (ref 0–53)
AST: 22 U/L (ref 0–37)
Albumin: 4.6 g/dL (ref 3.5–5.2)
Alkaline Phosphatase: 65 U/L (ref 39–117)
BUN: 12 mg/dL (ref 6–23)
CO2: 28 mEq/L (ref 19–32)
Calcium: 9.4 mg/dL (ref 8.4–10.5)
Chloride: 104 mEq/L (ref 96–112)
Creatinine, Ser: 1.05 mg/dL (ref 0.40–1.50)
GFR: 94.09 mL/min (ref >60.00)
Glucose, Bld: 81 mg/dL (ref 70–99)
Potassium: 3.7 mEq/L (ref 3.5–5.1)
Sodium: 140 mEq/L (ref 135–145)
Total Bilirubin: 0.3 mg/dL (ref 0.2–1.2)
Total Protein: 7.1 g/dL (ref 6.0–8.3)

## 2018-11-28 ENCOUNTER — Other Ambulatory Visit: Payer: Self-pay

## 2018-11-28 ENCOUNTER — Encounter: Payer: Self-pay | Admitting: Internal Medicine

## 2018-11-28 ENCOUNTER — Ambulatory Visit (INDEPENDENT_AMBULATORY_CARE_PROVIDER_SITE_OTHER): Payer: 59 | Admitting: Internal Medicine

## 2018-11-28 VITALS — BP 124/72 | HR 78 | Temp 98.2°F | Wt 193.0 lb

## 2018-11-28 DIAGNOSIS — B181 Chronic viral hepatitis B without delta-agent: Secondary | ICD-10-CM

## 2018-11-28 NOTE — Progress Notes (Signed)
    [facility]      Reason for Consult: chronic hepatitis B    Referring Physician: E Saguier PA    Patient ID: Jesse Moreno, male    DOB: 02-10-1976, 43 y.o.   MRN: [id number]  HPI:   Here for reevaluation for chronic hepatitis B.   I saw him previously about 7 years ago and had chronic inactive carrier state with normal LFTs, low level viremia.  He was hepatitis A Ab positive.  Never on treatment.  Did not return for follow up.  Sent back by his PCP.  No new issues, no known flare of hepatitis B.  Recent CMP with normal LFTs.  No known history of liver issues in family members.  No weight loss.    Past Medical History:  Diagnosis Date  . Hepatitis B     Prior to Admission medications   Not on File    Allergies  Allergen Reactions  . Ampicillin   . Aspirin   . Vancomycin     Social History   Tobacco Use  . Smoking status: Never Smoker  . Smokeless tobacco: Never Used  Substance Use Topics  . Alcohol use: No  . Drug use: No    Family History  Problem Relation Age of Onset  . Diabetes Mother   . Hypertension Mother   . Diabetes Father   . Hypertension Father   no known cirrhosis  Review of Systems  Constitutional: negative for fatigue, malaise, anorexia and weight loss Gastrointestinal: negative for diarrhea Integument/breast: negative for rash Musculoskeletal: negative for myalgias and arthralgias All other systems reviewed and are negative    Constitutional: in no apparent distress  Vitals:   11/28/18 1001  BP: 124/72  Pulse: 78  Temp: 98.2 F (36.8 C)   EYES: anicteric ENMT: no thrush Cardiovascular: Cor RRR Respiratory: CTA B; normal respiratory effort GI: Bowel sounds are normal, liver is not enlarged, spleen is not enlarged Musculoskeletal: no pedal edema noted Skin: negatives: no rash MS: no edema  Labs: Lab Results  Component Value Date   WBC 5.4 11/14/2018   HGB 15.5 11/14/2018   HCT 46.8 11/14/2018   MCV 79.9 11/14/2018   PLT 206.0 11/14/2018    Lab Results  Component Value Date   CREATININE 1.05 11/14/2018   BUN 12 11/14/2018   NA 140 11/14/2018   K 3.7 11/14/2018   CL 104 11/14/2018   CO2 28 11/14/2018    Lab Results  Component Value Date   ALT 21 11/14/2018   AST 22 11/14/2018   ALKPHOS 65 11/14/2018   BILITOT 0.3 11/14/2018   INR 2.36 (H) 08/25/2009     Assessment: chronic hepatitis B.  Will recheck his labs, do an ultrasound with elastography and have him return in 3-4 weeks to discuss results.  I suspect treatment won't be indicated but will see.  Unkown if he has had this since birth or more recent but no risk factors.    Plan: 1) labs and ultrasound rtc 4 weeks to discuss above.  Hepatitis A immune so no vaccine indicated.

## 2018-12-02 ENCOUNTER — Other Ambulatory Visit: Payer: Self-pay

## 2018-12-02 ENCOUNTER — Ambulatory Visit (HOSPITAL_COMMUNITY)
Admission: RE | Admit: 2018-12-02 | Discharge: 2018-12-02 | Disposition: A | Discharge status: Home / Self Care | Payer: 59 | Source: Ambulatory Visit | Attending: Internal Medicine | Admitting: Internal Medicine

## 2018-12-02 DIAGNOSIS — B181 Chronic viral hepatitis B without delta-agent: Secondary | ICD-10-CM | POA: Insufficient documentation

## 2018-12-02 DIAGNOSIS — B182 Chronic viral hepatitis C: Secondary | ICD-10-CM | POA: Diagnosis not present

## 2018-12-06 LAB — HIV ANTIBODY (ROUTINE TESTING W REFLEX): HIV 1&2 Ab, 4th Generation: NONREACTIVE

## 2018-12-06 LAB — HEPATITIS B E ANTIGEN: Hep B E Ag: NONREACTIVE

## 2018-12-06 LAB — HEPATITIS B SURFACE ANTIGEN: Hepatitis B Surface Ag: REACTIVE — AB

## 2018-12-06 LAB — LIVER FIBROSIS, FIBROTEST-ACTITEST
ALT: 33 U/L (ref 9–46)
Alpha-2-Macroglobulin: 170 mg/dL (ref 106–279)
Apolipoprotein A1: 126 mg/dL (ref 94–176)
Bilirubin: 0.3 mg/dL (ref 0.2–1.2)
Fibrosis Score: 0.18
GGT: 41 U/L (ref 3–95)
Haptoglobin: 76 mg/dL (ref 43–212)
Necroinflammat ACT Score: 0.14
Reference ID: 3007434

## 2018-12-06 LAB — HEPATITIS B CORE ANTIBODY, TOTAL: Hep B Core Total Ab: REACTIVE — AB

## 2018-12-06 LAB — HEPATITIS C ANTIBODY
Hepatitis C Ab: NONREACTIVE
SIGNAL TO CUT-OFF: 0.07 (ref <1.00)

## 2018-12-06 LAB — HEPATITIS B DNA, ULTRAQUANTITATIVE, PCR
Hepatitis B DNA (Calc): 3.12 Log IU/mL — ABNORMAL HIGH
Hepatitis B DNA: 1330 IU/mL — ABNORMAL HIGH

## 2018-12-06 LAB — HEPATITIS B E ANTIBODY: Hep B E Ab: REACTIVE — AB

## 2018-12-06 LAB — HEPATITIS DELTA ANTIBODY: Hepatitis D Ab, Total: POSITIVE — AB

## 2018-12-06 LAB — AFP TUMOR MARKER: AFP-Tumor Marker: 4.6 ng/mL (ref <6.1)

## 2018-12-29 ENCOUNTER — Encounter: Payer: Self-pay | Admitting: Internal Medicine

## 2018-12-29 ENCOUNTER — Ambulatory Visit (INDEPENDENT_AMBULATORY_CARE_PROVIDER_SITE_OTHER): Payer: 59 | Admitting: Internal Medicine

## 2018-12-29 ENCOUNTER — Other Ambulatory Visit: Payer: Self-pay

## 2018-12-29 VITALS — BP 145/78 | HR 73 | Temp 98.0°F

## 2018-12-29 DIAGNOSIS — B170 Acute delta-(super) infection of hepatitis B carrier: Secondary | ICD-10-CM | POA: Diagnosis not present

## 2018-12-29 DIAGNOSIS — IMO0002 Reserved for concepts with insufficient information to code with codable children: Secondary | ICD-10-CM | POA: Insufficient documentation

## 2018-12-29 DIAGNOSIS — B161 Acute hepatitis B with delta-agent without hepatic coma: Secondary | ICD-10-CM | POA: Insufficient documentation

## 2018-12-29 DIAGNOSIS — B180 Chronic viral hepatitis B with delta-agent: Secondary | ICD-10-CM

## 2018-12-29 NOTE — Progress Notes (Signed)
   Subjective:    Patient ID: Jesse Moreno, male    DOB: 07/12/1975, 43 y.o.   MRN: [id number]  HPI Here for follow up of chronic hepatitis B Came back last month after 7 years.  Labs now with a low level viremia of 1,330, negative hepatitis E Ag, positive hepatitis D Ab, and elastography of F0/1.  Never on treatment.  No new issues.     Review of Systems  Constitutional: Negative for fatigue and unexpected weight change.  Skin: Negative for rash.       Objective:   Physical Exam Eyes:     General: No scleral icterus. Skin:    Findings: No rash.  Neurological:     General: No focal deficit present.     Mental Status: He is alert.  Psychiatric:        Mood and Affect: Mood normal.   SH: no alcohol        Assessment & Plan:

## 2018-12-29 NOTE — Assessment & Plan Note (Addendum)
New finding, Positive Ab and I will check a PCR next visit. Discussed  Will no fibrosis and no inflammation, I do not suspect the hepatitis D is particularly active.

## 2018-12-29 NOTE — Addendum Note (Signed)
Addended by: Thayer Headings on: 12/29/2018 03:43 PM   Modules accepted: Orders

## 2018-12-29 NOTE — Assessment & Plan Note (Signed)
No indication for treatment with no liver inflammation, no fibrosis and minimal viral DNA.   Will continue to monitor every 6 months.

## 2018-12-30 ENCOUNTER — Other Ambulatory Visit: Payer: 59

## 2019-01-02 ENCOUNTER — Ambulatory Visit
Admission: RE | Admit: 2019-01-02 | Discharge: 2019-01-02 | Disposition: A | Discharge status: Home / Self Care | Payer: 59 | Source: Ambulatory Visit | Attending: Internal Medicine | Admitting: Internal Medicine

## 2019-01-02 DIAGNOSIS — B180 Chronic viral hepatitis B with delta-agent: Secondary | ICD-10-CM

## 2019-01-02 DIAGNOSIS — B1910 Unspecified viral hepatitis B without hepatic coma: Secondary | ICD-10-CM | POA: Diagnosis not present

## 2019-07-03 ENCOUNTER — Ambulatory Visit: Payer: 59 | Admitting: Internal Medicine

## 2019-07-05 ENCOUNTER — Ambulatory Visit (INDEPENDENT_AMBULATORY_CARE_PROVIDER_SITE_OTHER): Payer: No Typology Code available for payment source | Admitting: Internal Medicine

## 2019-07-05 ENCOUNTER — Encounter: Payer: Self-pay | Admitting: Internal Medicine

## 2019-07-05 ENCOUNTER — Other Ambulatory Visit: Payer: Self-pay

## 2019-07-05 VITALS — BP 140/94 | HR 69 | Temp 98.2°F | Ht 73.0 in | Wt 207.0 lb

## 2019-07-05 DIAGNOSIS — B170 Acute delta-(super) infection of hepatitis B carrier: Secondary | ICD-10-CM | POA: Diagnosis not present

## 2019-07-05 DIAGNOSIS — B180 Chronic viral hepatitis B with delta-agent: Secondary | ICD-10-CM | POA: Diagnosis not present

## 2019-07-05 DIAGNOSIS — IMO0002 Reserved for concepts with insufficient information to code with codable children: Secondary | ICD-10-CM

## 2019-07-05 NOTE — Assessment & Plan Note (Addendum)
I will recheck his LFTs and DNA today to verify he remains an inactive carrier.  I discussed treatment possibilities if he converts to active.

## 2019-07-05 NOTE — Assessment & Plan Note (Signed)
Ab positive so will check Ag and PCR.  With no fibrosis, I suspect it is inactive hepatitis D, though if active, will closely monitor and consider treatment or referral to hepatology.

## 2019-07-05 NOTE — Progress Notes (Signed)
   Subjective:    Patient ID: Jesse Moreno, male    DOB: 03/15/1976, 44 y.o.   MRN: [id number]  HPI Here for follow up of chronic, inactive carrier hepatitis B.  Also with hepatitis D Ab. No new issues.  Elastography and Fibrosure previously checked and F0, no fibrosis.  Ultrasound ok.     Review of Systems  Constitutional: Negative for fatigue and unexpected weight change.  Gastrointestinal: Negative for diarrhea and nausea.  Skin: Negative for rash.       Objective:   Physical Exam Constitutional:      Appearance: Normal appearance.  Eyes:     General: No scleral icterus. Pulmonary:     Effort: Pulmonary effort is normal.  Skin:    Coloration: Skin is not jaundiced.     Findings: No rash.  Neurological:     General: No focal deficit present.     Mental Status: He is alert.  Psychiatric:        Mood and Affect: Mood normal.   SH: no alcohol        Assessment & Plan:

## 2019-07-09 LAB — COMPLETE METABOLIC PANEL WITH GFR
AG Ratio: 1.8 (calc) (ref 1.0–2.5)
ALT: 56 U/L — ABNORMAL HIGH (ref 9–46)
AST: 36 U/L (ref 10–40)
Albumin: 4.7 g/dL (ref 3.6–5.1)
Alkaline phosphatase (APISO): 62 U/L (ref 36–130)
BUN: 11 mg/dL (ref 7–25)
CO2: 28 mmol/L (ref 20–32)
Calcium: 9.5 mg/dL (ref 8.6–10.3)
Chloride: 103 mmol/L (ref 98–110)
Creat: 1.11 mg/dL (ref 0.60–1.35)
GFR, Est African American: 95 mL/min/{1.73_m2} (ref >=60)
GFR, Est Non African American: 82 mL/min/{1.73_m2} (ref >=60)
Globulin: 2.6 g/dL (calc) (ref 1.9–3.7)
Glucose, Bld: 93 mg/dL (ref 65–99)
Potassium: 3.8 mmol/L (ref 3.5–5.3)
Sodium: 141 mmol/L (ref 135–146)
Total Bilirubin: 0.3 mg/dL (ref 0.2–1.2)
Total Protein: 7.3 g/dL (ref 6.1–8.1)

## 2019-07-09 LAB — HEPATITIS D VIRUS RNA, QUANTITATIVE REAL-TIME PCR
HEPATITIS D VIRUS RNA, QN, PCR: NOT DETECTED Log IU/mL
HEPATITIS D VIRUS RNA,QN,REAL TIME PCR: NOT DETECTED IU/mL

## 2019-07-09 LAB — HEPATITIS B SURFACE ANTIGEN: Hepatitis B Surface Ag: REACTIVE — AB

## 2019-07-09 LAB — HEPATITIS B DNA, ULTRAQUANTITATIVE, PCR
Hepatitis B DNA (Calc): 1.47 Log IU/mL — ABNORMAL HIGH
Hepatitis B DNA: 29 IU/mL — ABNORMAL HIGH

## 2019-07-09 LAB — HEPATITIS DELTA VIRUS ANTIGEN: Hepatitis D Antigen: NOT DETECTED

## 2020-01-03 ENCOUNTER — Other Ambulatory Visit: Payer: Self-pay

## 2020-01-03 ENCOUNTER — Ambulatory Visit (INDEPENDENT_AMBULATORY_CARE_PROVIDER_SITE_OTHER): Payer: No Typology Code available for payment source | Admitting: Medical

## 2020-01-03 ENCOUNTER — Encounter: Payer: Self-pay | Admitting: Medical

## 2020-01-03 ENCOUNTER — Ambulatory Visit: Payer: No Typology Code available for payment source | Admitting: Internal Medicine

## 2020-01-03 VITALS — BP 135/80 | HR 85 | Resp 18 | Ht 73.0 in | Wt 207.0 lb

## 2020-01-03 DIAGNOSIS — Z0000 Encounter for general adult medical examination without abnormal findings: Secondary | ICD-10-CM | POA: Diagnosis not present

## 2020-01-03 NOTE — Progress Notes (Signed)
Subjective:    Patient ID: Jesse Moreno, male    DOB: Sep 05, 1975, 44 y.o.   MRN: [id number]  HPI In for  CPE.  Pt is up to date on tdap and  covid vaccine. Will get flu vaccine in october  Up to date on hiv screen.  Pt exercises regularly. He jogs 3-4 miles about 3-4 times a week. He states he eats healthy. No caffeinated beverages. Marriage. 3 children.   Review of Systems  Constitutional: Negative for chills, fatigue and fever.  Respiratory: Negative for cough, chest tightness, shortness of breath and wheezing.   Cardiovascular: Negative for chest pain and palpitations.  Gastrointestinal: Negative for abdominal pain, blood in stool, diarrhea, nausea, rectal pain and vomiting.  Genitourinary: Negative for dysuria and frequency.  Musculoskeletal: Negative for back pain.  Skin: Negative for rash.  Neurological: Negative for dizziness, seizures, weakness, light-headedness and headaches.  Hematological: Negative for adenopathy. Does not bruise/bleed easily.  Psychiatric/Behavioral: Negative for behavioral problems, decreased concentration, dysphoric mood, self-injury and suicidal ideas. The patient is not nervous/anxious.     Past Medical History:  Diagnosis Date   Hepatitis B      Social History   Socioeconomic History   Marital status: Married    Spouse name: Not on file   Number of children: Not on file   Years of education: Not on file   Highest education level: Not on file  Occupational History   Not on file  Tobacco Use   Smoking status: Never Smoker   Smokeless tobacco: Never Used  Substance and Sexual Activity   Alcohol use: No   Drug use: No   Sexual activity: Yes    Comment: accepted condoms  Other Topics Concern   Not on file  Social History Narrative   Not on file   Social Determinants of Health   Financial Resource Strain:    Difficulty of Paying Living Expenses:   Food Insecurity:    Worried About Charity fundraiser in the  Last Year:    Arboriculturist in the Last Year:   Transportation Needs:    Film/video editor (Medical):    Lack of Transportation (Non-Medical):   Physical Activity:    Days of Exercise per Week:    Minutes of Exercise per Session:   Stress:    Feeling of Stress :   Social Connections:    Frequency of Communication with Friends and Family:    Frequency of Social Gatherings with Friends and Family:    Attends Religious Services:    Active Member of Clubs or Organizations:    Attends Music therapist:    Marital Status:   Intimate Partner Violence:    Fear of Current or Ex-Partner:    Emotionally Abused:    Physically Abused:    Sexually Abused:     No past surgical history on file.  Family History  Problem Relation Age of Onset   Diabetes Mother    Hypertension Mother    Diabetes Father    Hypertension Father     Allergies  Allergen Reactions   Ampicillin    Aspirin    Vancomycin     No current outpatient medications on file prior to visit.   No current facility-administered medications on file prior to visit.    BP 135/80    Pulse 85    Resp 18    Ht 6\' 1"  (1.854 m)    Wt 207 lb (93.9 kg)  SpO2 100%    BMI 27.31 kg/m       Objective:   Physical Exam  General Mental Status- Alert. General Appearance- Not in acute distress.   Skin General: Color- Normal Color. Moisture- Normal Moisture.  Neck Carotid Arteries- Normal color. Moisture- Normal Moisture. No carotid bruits. No JVD.  Chest and Lung Exam Auscultation: Breath Sounds:-Normal.  Cardiovascular Auscultation:Rythm- Regular. Murmurs & Other Heart Sounds:Auscultation of the heart reveals- No Murmurs.  Abdomen Inspection:-Inspeection Normal. Palpation/Percussion:Note:No mass. Palpation and Percussion of the abdomen reveal- Non Tender, Non Distended + BS, no rebound or guarding.    Neurologic Cranial Nerve exam:- CN III-XII intact(No nystagmus),  symmetric smile. Strength:- 5/5 equal and symmetric strength both upper and lower extrmities.      Assessment & Plan:  For you wellness exam today I have ordered cbc, cmp and lipid panel.  Vaccins appear up to date. Flu vaccine in October or sooner if cdc recommends.  Recommend exercise and healthy diet.  We will let you know lab results as they come in.  Follow up date appointment will be determined after lab review.   Mackie Pai, PA-C

## 2020-01-03 NOTE — Patient Instructions (Signed)
For you wellness exam today I have ordered cbc, cmp and lipid panel.  Vaccins appear up to date. Flu vaccine in October or sooner if cdc recommends.  Recommend exercise and healthy diet.  We will let you know lab results as they come in.  Follow up date appointment will be determined after lab review.    Preventive Care 51-42 Years Old, Male Preventive care refers to lifestyle choices and visits with your health care provider that can promote health and wellness. This includes:  A yearly physical exam. This is also called an annual well check.  Regular dental and eye exams.  Immunizations.  Screening for certain conditions.  Healthy lifestyle choices, such as eating a healthy diet, getting regular exercise, not using drugs or products that contain nicotine and tobacco, and limiting alcohol use. What can I expect for my preventive care visit? Physical exam Your health care provider will check:  Height and weight. These may be used to calculate body mass index (BMI), which is a measurement that tells if you are at a healthy weight.  Heart rate and blood pressure.  Your skin for abnormal spots. Counseling Your health care provider may ask you questions about:  Alcohol, tobacco, and drug use.  Emotional well-being.  Home and relationship well-being.  Sexual activity.  Eating habits.  Work and work Statistician. What immunizations do I need?  Influenza (flu) vaccine  This is recommended every year. Tetanus, diphtheria, and pertussis (Tdap) vaccine  You may need a Td booster every 10 years. Varicella (chickenpox) vaccine  You may need this vaccine if you have not already been vaccinated. Zoster (shingles) vaccine  You may need this after age 29. Measles, mumps, and rubella (MMR) vaccine  You may need at least one dose of MMR if you were born in 1957 or later. You may also need a second dose. Pneumococcal conjugate (PCV13) vaccine  You may need this if you  have certain conditions and were not previously vaccinated. Pneumococcal polysaccharide (PPSV23) vaccine  You may need one or two doses if you smoke cigarettes or if you have certain conditions. Meningococcal conjugate (MenACWY) vaccine  You may need this if you have certain conditions. Hepatitis A vaccine  You may need this if you have certain conditions or if you travel or work in places where you may be exposed to hepatitis A. Hepatitis B vaccine  You may need this if you have certain conditions or if you travel or work in places where you may be exposed to hepatitis B. Haemophilus influenzae type b (Hib) vaccine  You may need this if you have certain risk factors. Human papillomavirus (HPV) vaccine  If recommended by your health care provider, you may need three doses over 6 months. You may receive vaccines as individual doses or as more than one vaccine together in one shot (combination vaccines). Talk with your health care provider about the risks and benefits of combination vaccines. What tests do I need? Blood tests  Lipid and cholesterol levels. These may be checked every 5 years, or more frequently if you are over 47 years old.  Hepatitis C test.  Hepatitis B test. Screening  Lung cancer screening. You may have this screening every year starting at age 56 if you have a 30-pack-year history of smoking and currently smoke or have quit within the past 15 years.  Prostate cancer screening. Recommendations will vary depending on your family history and other risks.  Colorectal cancer screening. All adults should have this  screening starting at age 38 and continuing until age 76. Your health care provider may recommend screening at age 63 if you are at increased risk. You will have tests every 1-10 years, depending on your results and the type of screening test.  Diabetes screening. This is done by checking your blood sugar (glucose) after you have not eaten for a while  (fasting). You may have this done every 1-3 years.  Sexually transmitted disease (STD) testing. Follow these instructions at home: Eating and drinking  Eat a diet that includes fresh fruits and vegetables, whole grains, lean protein, and low-fat dairy products.  Take vitamin and mineral supplements as recommended by your health care provider.  Do not drink alcohol if your health care provider tells you not to drink.  If you drink alcohol: ? Limit how much you have to 0-2 drinks a day. ? Be aware of how much alcohol is in your drink. In the [place] one drink equals one 12 oz bottle of beer (355 mL), one 5 oz glass of wine (148 mL), or one 1 oz glass of hard liquor (44 mL). Lifestyle  Take daily care of your teeth and gums.  Stay active. Exercise for at least 30 minutes on 5 or more days each week.  Do not use any products that contain nicotine or tobacco, such as cigarettes, e-cigarettes, and chewing tobacco. If you need help quitting, ask your health care provider.  If you are sexually active, practice safe sex. Use a condom or other form of protection to prevent STIs (sexually transmitted infections).  Talk with your health care provider about taking a low-dose aspirin every day starting at age 45. What's next?  Go to your health care provider once a year for a well check visit.  Ask your health care provider how often you should have your eyes and teeth checked.  Stay up to date on all vaccines. This information is not intended to replace advice given to you by your health care provider. Make sure you discuss any questions you have with your health care provider. Document Revised: 04/28/2018 Document Reviewed: 04/28/2018 Elsevier Patient Education  2020 [employer].

## 2020-01-04 ENCOUNTER — Other Ambulatory Visit (INDEPENDENT_AMBULATORY_CARE_PROVIDER_SITE_OTHER): Payer: No Typology Code available for payment source

## 2020-01-04 DIAGNOSIS — R739 Hyperglycemia, unspecified: Secondary | ICD-10-CM | POA: Diagnosis not present

## 2020-01-04 LAB — COMPREHENSIVE METABOLIC PANEL
ALT: 28 U/L (ref 0–53)
AST: 22 U/L (ref 0–37)
Albumin: 4.5 g/dL (ref 3.5–5.2)
Alkaline Phosphatase: 66 U/L (ref 39–117)
BUN: 11 mg/dL (ref 6–23)
CO2: 29 mEq/L (ref 19–32)
Calcium: 9.5 mg/dL (ref 8.4–10.5)
Chloride: 102 mEq/L (ref 96–112)
Creatinine, Ser: 1.18 mg/dL (ref 0.40–1.50)
GFR: 81.78 mL/min (ref >60.00)
Glucose, Bld: 105 mg/dL — ABNORMAL HIGH (ref 70–99)
Potassium: 3.7 mEq/L (ref 3.5–5.1)
Sodium: 137 mEq/L (ref 135–145)
Total Bilirubin: 0.3 mg/dL (ref 0.2–1.2)
Total Protein: 7.3 g/dL (ref 6.0–8.3)

## 2020-01-04 LAB — CBC WITH DIFFERENTIAL/PLATELET
Basophils Absolute: 0 10*3/uL (ref 0.0–0.1)
Basophils Relative: 0.5 % (ref 0.0–3.0)
Eosinophils Absolute: 0.2 10*3/uL (ref 0.0–0.7)
Eosinophils Relative: 3.2 % (ref 0.0–5.0)
HCT: 44.9 % (ref 39.0–52.0)
Hemoglobin: 14.9 g/dL (ref 13.0–17.0)
Lymphocytes Relative: 37.9 % (ref 12.0–46.0)
Lymphs Abs: 2.1 10*3/uL (ref 0.7–4.0)
MCHC: 33.3 g/dL (ref 30.0–36.0)
MCV: 79.5 fl (ref 78.0–100.0)
Monocytes Absolute: 0.7 10*3/uL (ref 0.1–1.0)
Monocytes Relative: 12.7 % — ABNORMAL HIGH (ref 3.0–12.0)
Neutro Abs: 2.5 10*3/uL (ref 1.4–7.7)
Neutrophils Relative %: 45.7 % (ref 43.0–77.0)
Platelets: 201 10*3/uL (ref 150.0–400.0)
RBC: 5.64 Mil/uL (ref 4.22–5.81)
RDW: 13.2 % (ref 11.5–15.5)
WBC: 5.5 10*3/uL (ref 4.0–10.5)

## 2020-01-04 LAB — LIPID PANEL
Cholesterol: 185 mg/dL (ref 0–200)
HDL: 36.5 mg/dL — ABNORMAL LOW (ref >39.00)
LDL Cholesterol: 128 mg/dL — ABNORMAL HIGH (ref 0–99)
NonHDL: 148.91
Total CHOL/HDL Ratio: 5
Triglycerides: 104 mg/dL (ref 0.0–149.0)
VLDL: 20.8 mg/dL (ref 0.0–40.0)

## 2020-01-04 LAB — HEMOGLOBIN A1C: Hgb A1c MFr Bld: 5.9 % (ref 4.6–6.5)

## 2020-05-03 IMAGING — US US ABDOMEN LIMITED W/ ELASTOGRAPHY
1 series · 13 of 25 positions shown · non-contrast
Comparison: None.

CLINICAL DATA: Chronic hepatitis-C without hepatic coma.

EXAM:
US ABDOMEN LIMITED - RIGHT UPPER QUADRANT
ULTRASOUND HEPATIC ELASTOGRAPHY
TECHNIQUE: Limited right upper quadrant abdominal ultrasound was performed. In
addition, ultrasound elastography evaluation of the liver was
performed. A region of interest was placed in the right lobe of the
liver. Following application of a compressive sonographic pulse,
shear waves were detected in the adjacent hepatic tissue and the
shear wave velocity was calculated. Multiple assessments were
performed at the selected site. Median shear wave velocity is
correlated to a Metavir fibrosis score.

[Series 1: us abdomen limited w/ elastography · 13 of 58 slices shown]
[im 1/58]
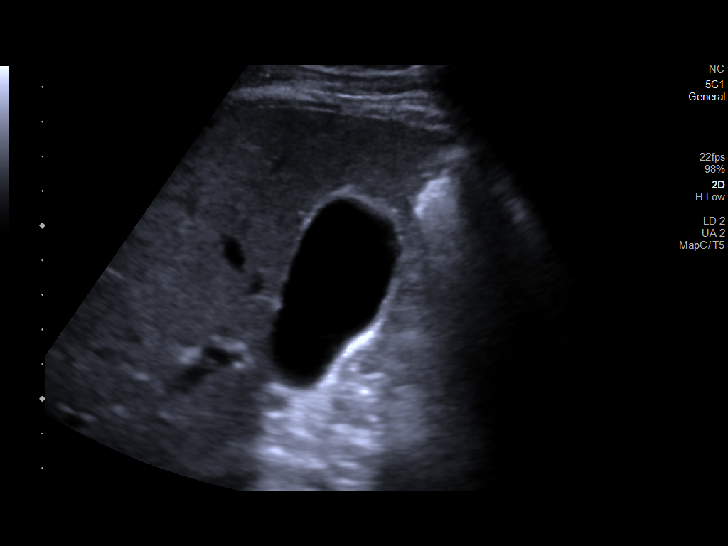
[im 5/58]
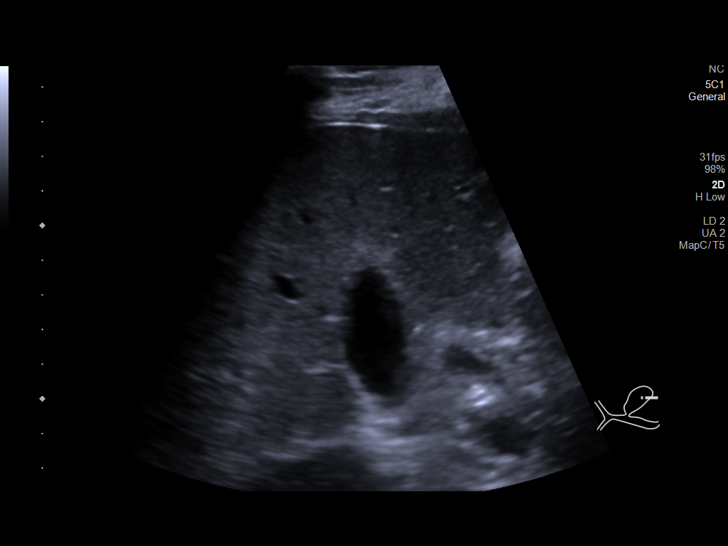
[im 10/58]
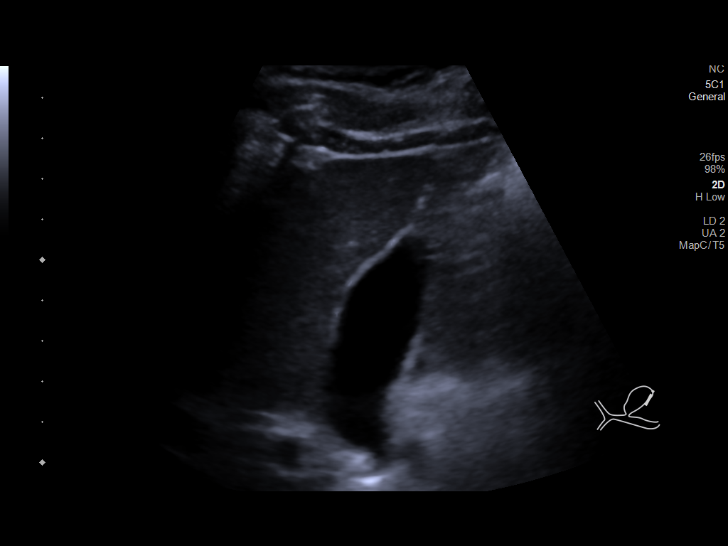
[im 15/58]
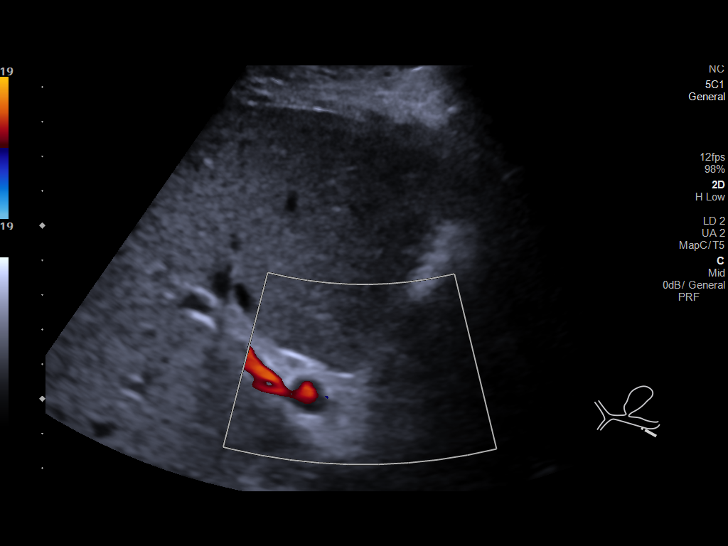
[im 20/58]
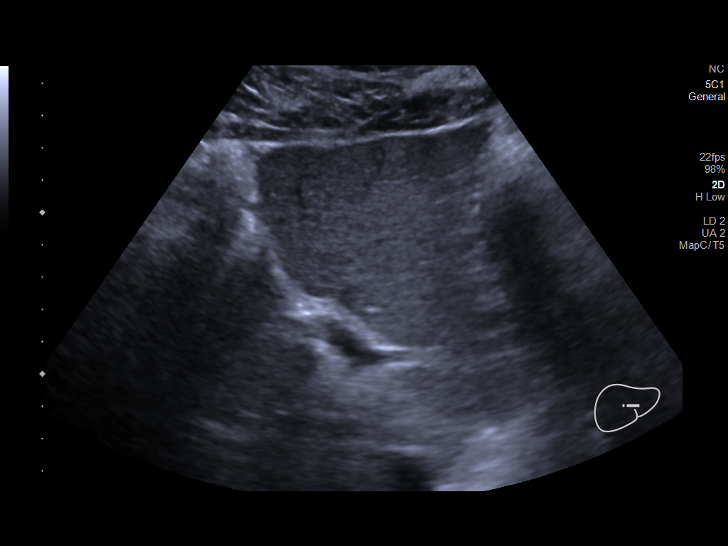
[im 24/58]
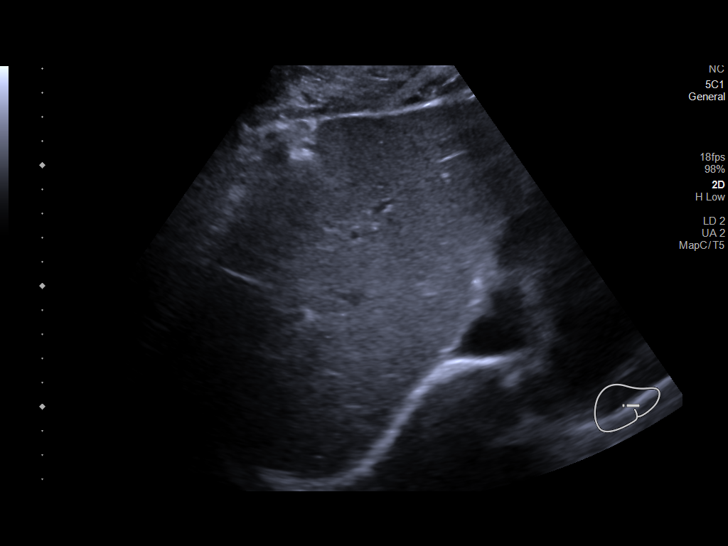
[im 29/58]
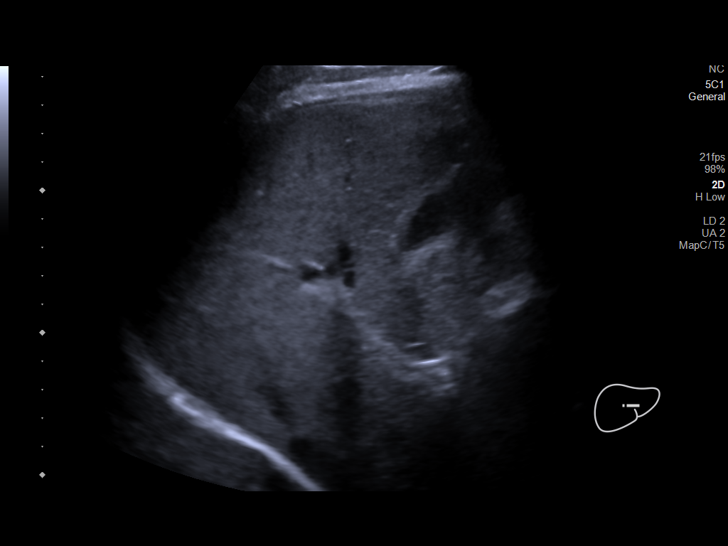
[im 34/58]
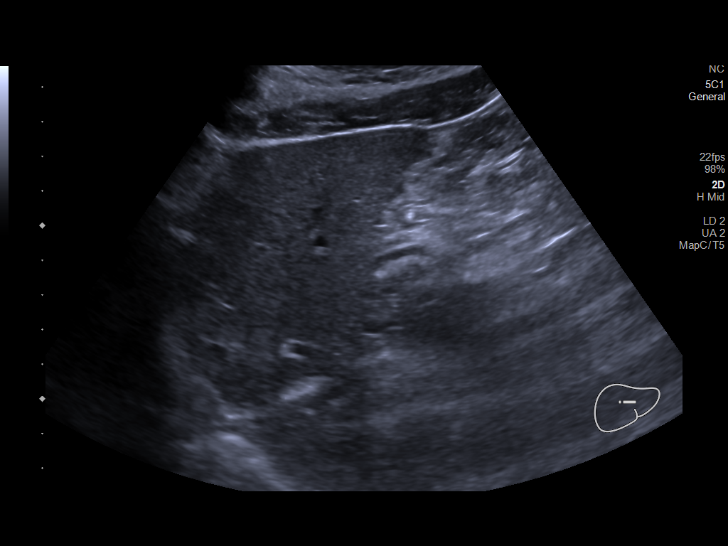
[im 39/58]
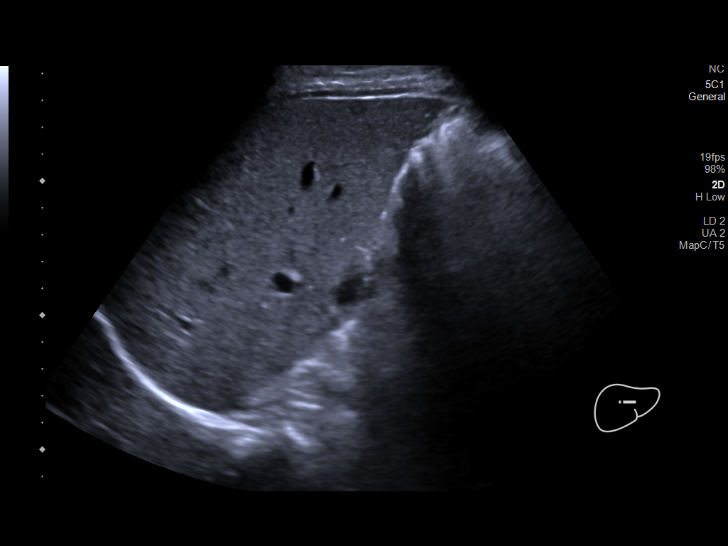
[im 43/58]
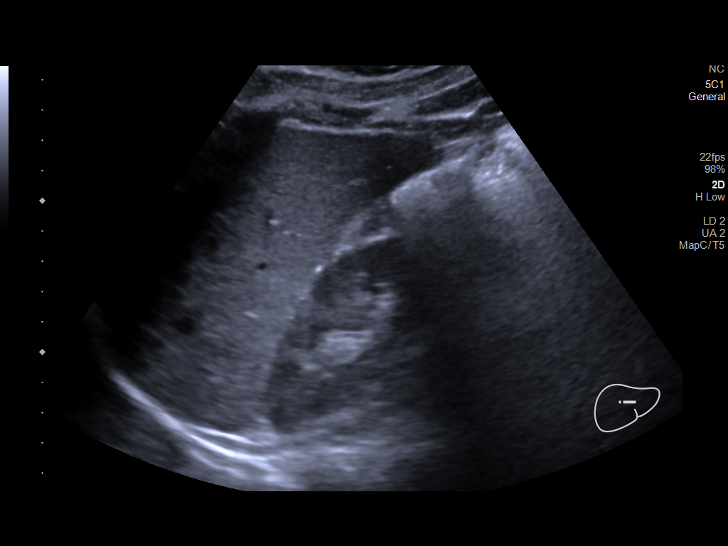
[im 48/58]
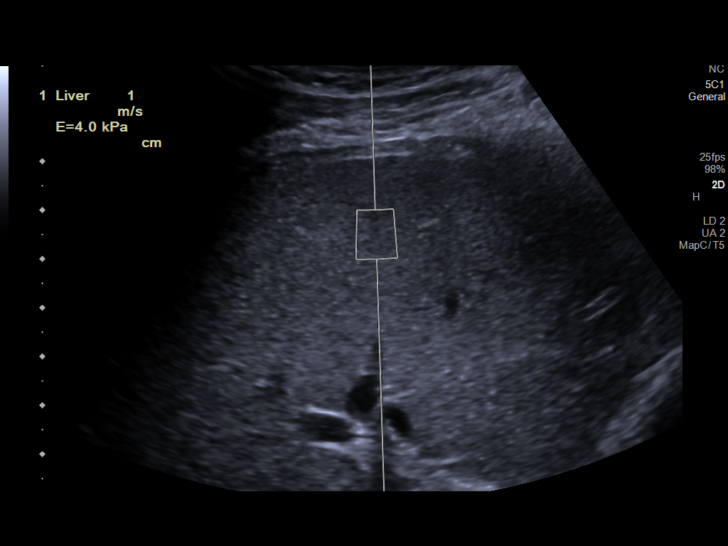
[im 53/58]
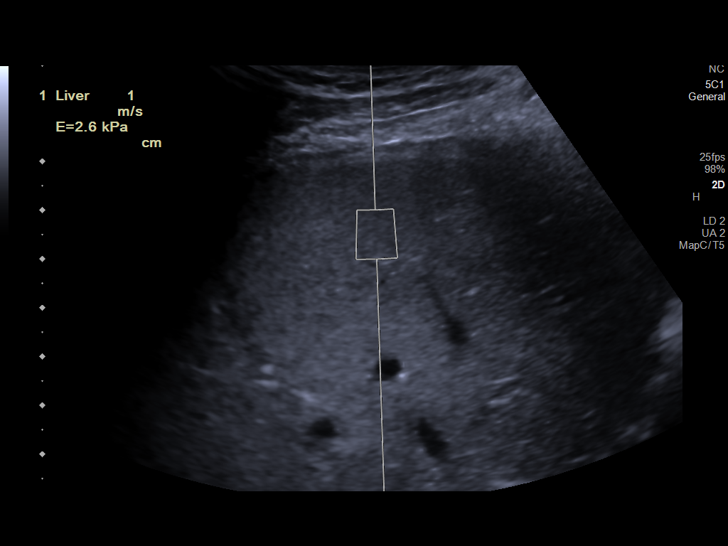
[im 58/58]
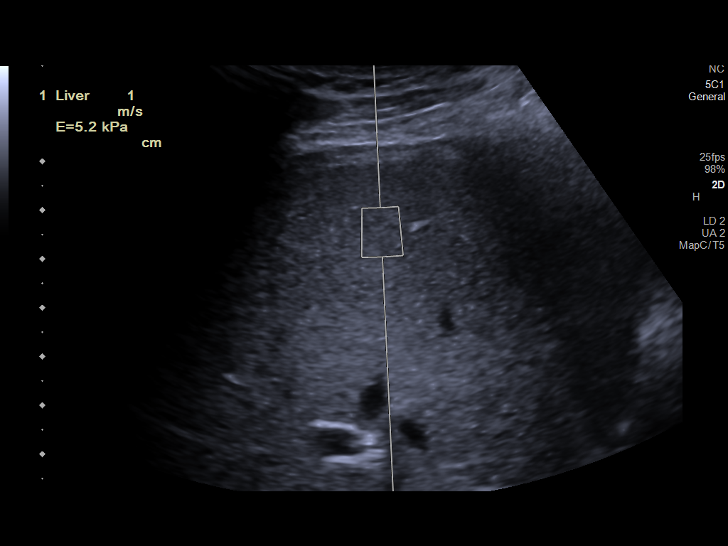

[13 of 25 positions shown; findings below may reference images not displayed]

FINDINGS: ULTRASOUND ABDOMEN LIMITED RIGHT UPPER QUADRANT

Gallbladder:

No gallstones or wall thickening visualized. No sonographic Murphy
sign noted.

Common bile duct:

Diameter: 4 mm, within normal limits.

Liver:

No focal lesion identified. Within normal limits in parenchymal
echogenicity. Portal vein is patent on color Doppler imaging with
normal direction of blood flow towards the liver.

ULTRASOUND HEPATIC ELASTOGRAPHY

Device: Siemens Helix VTQ

Patient position: Supine

Transducer 5C1

Number of measurements: 10

Hepatic segment:  8

Median velocity:   1.07 m/sec

IQR:

IQR/Median velocity ratio:

Corresponding Metavir fibrosis score:  F0/F1

Risk of fibrosis: Minimal

Limitations of exam: None

Please note that abnormal shear wave velocities may also be
identified in clinical settings other than with hepatic fibrosis,
such as: acute hepatitis, elevated right heart and central venous
pressures including use of beta blockers, Tarla disease
(Rutyna), infiltrative processes such as
mastocytosis/amyloidosis/infiltrative tumor, extrahepatic
cholestasis, in the post-prandial state, and liver transplantation.
Correlation with patient history, laboratory data, and clinical
condition recommended.
IMPRESSION: ULTRASOUND ABDOMEN:

Negative.  No hepatobiliary abnormality identified.

ULTRASOUND HEPATIC ELASTOGRAHY:

Median hepatic shear wave velocity is calculated at 1.07 m/sec.

Corresponding Metavir fibrosis score is F0/F1.

Risk of fibrosis is Minimal.

Follow-up: None required

## 2020-07-15 ENCOUNTER — Ambulatory Visit: Payer: No Typology Code available for payment source | Attending: Internal Medicine

## 2020-07-15 ENCOUNTER — Other Ambulatory Visit (HOSPITAL_COMMUNITY): Payer: Self-pay | Admitting: Internal Medicine

## 2020-07-15 DIAGNOSIS — Z23 Encounter for immunization: Secondary | ICD-10-CM

## 2020-07-15 MED FILL — PFIZER-BIONT COVID-19 VAC-T: 30 | 21 days supply | Qty: 0 | Fill #0

## 2020-07-15 NOTE — Progress Notes (Signed)
   Covid-19 Vaccination Clinic  Name:  Jesse Moreno    MRN: [id number] DOB: [birth year]  07/15/2020  Jesse Moreno was observed post Covid-19 immunization for 15 minutes without incident. He was provided with Vaccine Information Sheet and instruction to access the V-Safe system.   Jesse Moreno was instructed to call 911 with any severe reactions post vaccine: Marland Kitchen Difficulty breathing  . Swelling of face and throat  . A fast heartbeat  . A bad rash all over body  . Dizziness and weakness   Immunizations Administered    Name Date Dose VIS Date Route   PFIZER Comrnaty(Gray TOP) Covid-19 Vaccine 07/15/2020  9:32 AM 0.3 mL 04/25/2020 Intramuscular   Manufacturer: [employer]   Lot: [id number]   NDC: [phone]

## 2022-11-26 ENCOUNTER — Ambulatory Visit
Admission: EM | Admit: 2022-11-26 | Discharge: 2022-11-26 | Disposition: A | Discharge status: Home / Self Care | Payer: 59 | Attending: Family Medicine | Admitting: Family Medicine

## 2022-11-26 ENCOUNTER — Other Ambulatory Visit (HOSPITAL_COMMUNITY): Payer: Self-pay

## 2022-11-26 DIAGNOSIS — B029 Zoster without complications: Secondary | ICD-10-CM

## 2022-11-26 MED ORDER — VALACYCLOVIR HCL 1 G PO TABS
1000.0000 mg | ORAL_TABLET | Freq: Three times a day (TID) | ORAL | 0 refills | Status: AC
  Filled 2022-11-26: qty 21, 7d supply, fill #0

## 2022-11-26 MED ORDER — TRAMADOL HCL 50 MG PO TABS
50.0000 mg | ORAL_TABLET | Freq: Four times a day (QID) | ORAL | 0 refills | Status: AC | PRN
Start: 2022-11-26 — End: ?
  Filled 2022-11-26: qty 12, 3d supply, fill #0

## 2022-11-26 NOTE — ED Triage Notes (Signed)
Patient presents to [facility] for rash to front right chest area that radiates to right upper back area x 5 days. Has been treating with antibiotic ointment and treating pain with ibuprofen.   Denies fever.

## 2022-11-26 NOTE — Discharge Instructions (Addendum)
Take valacyclovir 1000 mg--1 tablet 3 times daily for 7 days  Take tramadol 50 mg-- 1 tablet every 6 hours as needed for pain.  This medication can make you sleepy or dizzy   

## 2022-11-26 NOTE — ED Provider Notes (Signed)
[facility]    CSN: [id number] Arrival date & time: 11/26/22  6962      History   Chief Complaint Chief Complaint  Patient presents with   Rash    HPI Jesse Moreno is a 47 y.o. male.   HPI Here for painful and mildly pruritic rash.  It began on the evening of July 8 or early in the morning on July ninth.  It is on his right upper thoracic area on his back and in the right anterior chest also.  No fever or chills  He is allergic to amoxicillin, aspirin, and vancomycin.  He has taken ibuprofen for the pain and has not helped much.  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 No pertinent surgical history.     Home Medications    Prior to Admission medications   Medication Sig Start Date End Date Taking? Authorizing Provider  traMADol (ULTRAM) 50 MG tablet Take 1 tablet (50 mg total) by mouth every 6 (six) hours as needed (pain). 11/26/22  Yes Mercedees Convery, Janace Aris, MD  valACYclovir (VALTREX) 1000 MG tablet Take 1 tablet (1,000 mg total) by mouth 3 (three) times daily for 7 days. 11/26/22 12/03/22 Yes Zenia Resides, MD    Family History Family History  Problem Relation Age of Onset   Diabetes Mother    Hypertension Mother    Diabetes Father    Hypertension Father     Social History Social History   Tobacco Use   Smoking status: Never   Smokeless tobacco: Never  Substance Use Topics   Alcohol use: No   Drug use: No     Allergies   Ampicillin, Aspirin, and Vancomycin   Review of Systems Review of Systems   Physical Exam Triage Vital Signs ED Triage Vitals  Encounter Vitals Group     BP 11/26/22 0823 (!) 149/83     Systolic BP Percentile --      Diastolic BP Percentile --      Pulse Rate  11/26/22 0823 75     Resp 11/26/22 0823 18     Temp 11/26/22 0823 98.7 F (37.1 C)     Temp Source 11/26/22 0823 Oral     SpO2 11/26/22 0823 97 %     Weight --      Height --      Head Circumference --      Peak Flow --      Pain Score 11/26/22 0830 8     Pain Loc --      Pain Education --      Exclude from Growth Chart --    No data found.  Updated Vital Signs BP (!) 149/83 (BP Location: Right Arm)   Pulse 75   Temp 98.7 F (37.1 C) (Oral)   Resp 18   SpO2 97%   Visual Acuity Right Eye Distance:   Left Eye Distance:   Bilateral Distance:    Right Eye Near:   Left Eye Near:    Bilateral Near:     Physical Exam Vitals reviewed.  Constitutional:      General: He is not in acute distress.    Appearance: He is not ill-appearing, toxic-appearing or diaphoretic.  Cardiovascular:  Rate and Rhythm: Normal rate and regular rhythm.  Skin:    Coloration: Skin is not pale.     Comments: There is a herpetiform rash with unroofed blisters and actually eschar starting to form.  It is on his right thoracic area and on his right anterior chest.  No sign of secondary infection.  Neurological:     General: No focal deficit present.     Mental Status: He is oriented to person, place, and time.  Psychiatric:        Behavior: Behavior normal.      UC Treatments / Results  Labs (all labs ordered are listed, but only abnormal results are displayed) Labs Reviewed - No data to display  EKG   Radiology No results found.  Procedures Procedures (including critical care time)  Medications Ordered in UC Medications - No data to display  Initial Impression / Assessment and Plan / UC Course  I have reviewed the triage vital signs and the nursing notes.  Pertinent labs & imaging results that were available during my care of the patient were reviewed by me and considered in my medical decision making (see chart for details).    Valtrex is sent to treat the shingles, as we  apparently are within the 72-hour mark of when the symptoms started.  Tramadol was sent in for pain since the ibuprofen have not helped.   Final Clinical Impressions(s) / UC Diagnoses   Final diagnoses:  Herpes zoster without complication     Discharge Instructions      Take valacyclovir 1000 mg--1 tablet 3 times daily for 7 days  Take tramadol 50 mg-- 1 tablet every 6 hours as needed for pain.  This medication can make you sleepy or dizzy      ED Prescriptions     Medication Sig Dispense Auth. Provider   valACYclovir (VALTREX) 1000 MG tablet Take 1 tablet (1,000 mg total) by mouth 3 (three) times daily for 7 days. 21 tablet Maximino Cozzolino, Janace Aris, MD   traMADol (ULTRAM) 50 MG tablet Take 1 tablet (50 mg total) by mouth every 6 (six) hours as needed (pain). 12 tablet Aaliyha Mumford, Janace Aris, MD      I have reviewed the PDMP during this encounter.   Zenia Resides, MD 11/26/22 1007

## 2022-12-03 ENCOUNTER — Encounter: Payer: 59 | Admitting: Medical

## 2022-12-08 ENCOUNTER — Ambulatory Visit (INDEPENDENT_AMBULATORY_CARE_PROVIDER_SITE_OTHER): Payer: 59 | Admitting: Medical

## 2022-12-08 ENCOUNTER — Encounter: Payer: Self-pay | Admitting: Medical

## 2022-12-08 VITALS — BP 138/80 | HR 81 | Temp 98.0°F | Resp 18 | Ht 73.0 in | Wt 198.0 lb

## 2022-12-08 DIAGNOSIS — Z125 Encounter for screening for malignant neoplasm of prostate: Secondary | ICD-10-CM

## 2022-12-08 DIAGNOSIS — R030 Elevated blood-pressure reading, without diagnosis of hypertension: Secondary | ICD-10-CM | POA: Diagnosis not present

## 2022-12-08 DIAGNOSIS — Z0000 Encounter for general adult medical examination without abnormal findings: Secondary | ICD-10-CM

## 2022-12-08 DIAGNOSIS — Z1211 Encounter for screening for malignant neoplasm of colon: Secondary | ICD-10-CM | POA: Diagnosis not present

## 2022-12-08 DIAGNOSIS — G629 Polyneuropathy, unspecified: Secondary | ICD-10-CM

## 2022-12-08 NOTE — Progress Notes (Signed)
Subjective:    Patient ID: Jesse Moreno, male    DOB: 05-Oct-1975, 47 y.o.   MRN: [id number]  HPI  Pt has not been seen in almost 3 years.   Pt not fasting. He did eat eggs this morning at 8 am.  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  Pt exercises regularly. He jogs 3-4 miles about 1-2 times a week. He states he eats healthy.(Weekend some light weights at gy) No caffeinated beverages. Marriage. 3 children.   Pt never had colonoscopy.    Review of Systems  Constitutional:  Negative for chills, fatigue and fever.  HENT:  Negative for congestion, ear discharge and ear pain.   Respiratory:  Negative for chest tightness, shortness of breath and wheezing.   Cardiovascular:  Negative for chest pain and palpitations.  Gastrointestinal:  Negative for abdominal pain, diarrhea and rectal pain.  Musculoskeletal:  Negative for back pain, joint swelling and neck stiffness.  Skin:  Positive for rash. Negative for wound.       3 weeks ago pt got shingles outbreak on rt side back. He was given antiviral. Also gave tramadol.   Pt states he is using ibuprofen 400 mg otc twice a day. This controls shingles pain adequate. Declined tramaod.  Hematological:  Negative for adenopathy. Does not bruise/bleed easily.  Psychiatric/Behavioral:  Negative for confusion and decreased concentration.    Past Medical History:  Diagnosis Date   Hepatitis B      Social History   Socioeconomic History   Marital status: Married    Spouse name: Not on file   Number of children: Not on file   Years of education: Not on file   Highest education level: Not on file  Occupational History   Not on file  Tobacco Use   Smoking status: Never   Smokeless tobacco: Never  Substance and Sexual Activity   Alcohol use: No   Drug use: No   Sexual activity: Yes    Comment: accepted condoms  Other Topics Concern   Not on file  Social History Narrative   Not on file   Social Determinants of Health   Financial  Resource Strain: Not on file  Food Insecurity: Not on file  Transportation Needs: Not on file  Physical Activity: Not on file  Stress: Not on file  Social Connections: Not on file  Intimate Partner Violence: Not on file    No past surgical history on file.  Family History  Problem Relation Age of Onset   Diabetes Mother    Hypertension Mother    Diabetes Father    Hypertension Father     Allergies  Allergen Reactions   Ampicillin    Aspirin    Vancomycin     Current Outpatient Medications on File Prior to Visit  Medication Sig Dispense Refill   traMADol (ULTRAM) 50 MG tablet Take 1 tablet (50 mg total) by mouth every 6 (six) hours as needed (pain). 12 tablet 0   No current facility-administered medications on file prior to visit.    BP 138/80   Pulse 81   Temp 98 F (36.7 C)   Resp 18   Ht 6\' 1"  (1.854 m)   Wt 198 lb (89.8 kg)   SpO2 98%   BMI 26.12 kg/m       Objective:   Physical Exam   General Mental Status- Alert. General Appearance- Not in acute distress.   Skin General: Color- Normal Color. Moisture- Normal Moisture.  Neck Carotid  Arteries- Normal color. Moisture- Normal Moisture. No carotid bruits. No JVD.  Chest and Lung Exam Auscultation: Breath Sounds:-Normal.  Cardiovascular Auscultation:Rythm- Regular. Murmurs & Other Heart Sounds:Auscultation of the heart reveals- No Murmurs.  Abdomen Inspection:-Inspeection Normal. Palpation/Percussion:Note:No mass. Palpation and Percussion of the abdomen reveal- Non Tender, Non Distended + BS, no rebound or guarding.   Neurologic Cranial Nerve exam:- CN III-XII intact(No nystagmus), symmetric smile. Strength:- 5/5 equal and symmetric strength both upper and lower extremities.    Rt side thorax- upper poirtion mid thoracic rapping around to upper chest scattere hyperpigmented scarring from wear prior vesicles were present. Tender to light touch on dermatome. No warm to palpation. No edema.     Assessment & Plan:   Patient Instructions  For you wellness exam today I have ordered cbc, cmp and lipid panel.  Vaccine up to date.  Recommend exercise and healthy diet.  We will let you know lab results as they come in.  Follow up date appointment will be determined after lab review.    For post shingles nerve pain can continue low dose ibuprofen. If you feel you need stronger med let me know and can rx tramadol.  Recommend ask you insurance if you can get (they cover)shingix vaccine before age of 79 since you had recent shingles.  Bp better on recheck. Want you to check bp 3-4 times over next week. Check bp before you take ibuprofen as nsaid can increase bp. Want bp to be closer to 130/80.         Esperanza Richters, PA-C

## 2022-12-08 NOTE — Addendum Note (Signed)
Addended by: Gwenevere Abbot on: 12/08/2022 11:55 AM   Modules accepted: Orders

## 2022-12-08 NOTE — Patient Instructions (Addendum)
For you wellness exam today I have ordered cbc, cmp and lipid panel.  Vaccine up to date.  Recommend exercise and healthy diet.  We will let you know lab results as they come in.  Follow up date appointment will be determined after lab review.    For post shingles nerve pain can continue low dose ibuprofen. If you feel you need stronger med let me know and can rx tramadol.  Recommend ask you insurance if you can get (they cover)shingix vaccine before age of 11 since you had recent shingles.  Bp better on recheck. Want you to check bp 3-4 times over next week. Check bp before you take ibuprofen as nsaid can increase bp. Want bp to be closer to 130/80.  Preventive Care 78-45 Years Old, Male Preventive care refers to lifestyle choices and visits with your health care provider that can promote health and wellness. Preventive care visits are also called wellness exams. What can I expect for my preventive care visit? Counseling During your preventive care visit, your health care provider may ask about your: Medical history, including: Past medical problems. Family medical history. Current health, including: Emotional well-being. Home life and relationship well-being. Sexual activity. Lifestyle, including: Alcohol, nicotine or tobacco, and drug use. Access to firearms. Diet, exercise, and sleep habits. Safety issues such as seatbelt and bike helmet use. Sunscreen use. Work and work Astronomer. Physical exam Your health care provider will check your: Height and weight. These may be used to calculate your BMI (body mass index). BMI is a measurement that tells if you are at a healthy weight. Waist circumference. This measures the distance around your waistline. This measurement also tells if you are at a healthy weight and may help predict your risk of certain diseases, such as type 2 diabetes and high blood pressure. Heart rate and blood pressure. Body temperature. Skin for abnormal  spots. What immunizations do I need?  Vaccines are usually given at various ages, according to a schedule. Your health care provider will recommend vaccines for you based on your age, medical history, and lifestyle or other factors, such as travel or where you work. What tests do I need? Screening Your health care provider may recommend screening tests for certain conditions. This may include: Lipid and cholesterol levels. Diabetes screening. This is done by checking your blood sugar (glucose) after you have not eaten for a while (fasting). Hepatitis B test. Hepatitis C test. HIV (human immunodeficiency virus) test. STI (sexually transmitted infection) testing, if you are at risk. Lung cancer screening. Prostate cancer screening. Colorectal cancer screening. Talk with your health care provider about your test results, treatment options, and if necessary, the need for more tests. Follow these instructions at home: Eating and drinking  Eat a diet that includes fresh fruits and vegetables, whole grains, lean protein, and low-fat dairy products. Take vitamin and mineral supplements as recommended by your health care provider. Do not drink alcohol if your health care provider tells you not to drink. If you drink alcohol: Limit how much you have to 0-2 drinks a day. Know how much alcohol is in your drink. In the [place] one drink equals one 12 oz bottle of beer (355 mL), one 5 oz glass of wine (148 mL), or one 1 oz glass of hard liquor (44 mL). Lifestyle Brush your teeth every morning and night with fluoride toothpaste. Floss one time each day. Exercise for at least 30 minutes 5 or more days each week. Do not use any  products that contain nicotine or tobacco. These products include cigarettes, chewing tobacco, and vaping devices, such as e-cigarettes. If you need help quitting, ask your health care provider. Do not use drugs. If you are sexually active, practice safe sex. Use a condom or  other form of protection to prevent STIs. Take aspirin only as told by your health care provider. Make sure that you understand how much to take and what form to take. Work with your health care provider to find out whether it is safe and beneficial for you to take aspirin daily. Find healthy ways to manage stress, such as: Meditation, yoga, or listening to music. Journaling. Talking to a trusted person. Spending time with friends and family. Minimize exposure to UV radiation to reduce your risk of skin cancer. Safety Always wear your seat belt while driving or riding in a vehicle. Do not drive: If you have been drinking alcohol. Do not ride with someone who has been drinking. When you are tired or distracted. While texting. If you have been using any mind-altering substances or drugs. Wear a helmet and other protective equipment during sports activities. If you have firearms in your house, make sure you follow all gun safety procedures. What's next? Go to your health care provider once a year for an annual wellness visit. Ask your health care provider how often you should have your eyes and teeth checked. Stay up to date on all vaccines. This information is not intended to replace advice given to you by your health care provider. Make sure you discuss any questions you have with your health care provider. Document Revised: 10/30/2020 Document Reviewed: 10/30/2020 Elsevier Patient Education  2024 [employer].

## 2022-12-10 ENCOUNTER — Other Ambulatory Visit (INDEPENDENT_AMBULATORY_CARE_PROVIDER_SITE_OTHER): Payer: 59

## 2022-12-10 DIAGNOSIS — Z125 Encounter for screening for malignant neoplasm of prostate: Secondary | ICD-10-CM

## 2022-12-10 DIAGNOSIS — Z0000 Encounter for general adult medical examination without abnormal findings: Secondary | ICD-10-CM

## 2022-12-10 LAB — CBC WITH DIFFERENTIAL/PLATELET
Basophils Absolute: 0.1 10*3/uL (ref 0.0–0.1)
Basophils Relative: 1 % (ref 0.0–3.0)
Eosinophils Absolute: 0.1 10*3/uL (ref 0.0–0.7)
Eosinophils Relative: 1.8 % (ref 0.0–5.0)
HCT: 45.7 % (ref 39.0–52.0)
Hemoglobin: 14.6 g/dL (ref 13.0–17.0)
Lymphocytes Relative: 35.9 % (ref 12.0–46.0)
Lymphs Abs: 2 10*3/uL (ref 0.7–4.0)
MCHC: 32 g/dL (ref 30.0–36.0)
MCV: 80.5 fl (ref 78.0–100.0)
Monocytes Absolute: 0.6 10*3/uL (ref 0.1–1.0)
Monocytes Relative: 10.4 % (ref 3.0–12.0)
Neutro Abs: 2.9 10*3/uL (ref 1.4–7.7)
Neutrophils Relative %: 50.9 % (ref 43.0–77.0)
Platelets: 255 10*3/uL (ref 150.0–400.0)
RBC: 5.68 Mil/uL (ref 4.22–5.81)
RDW: 13.9 % (ref 11.5–15.5)
WBC: 5.7 10*3/uL (ref 4.0–10.5)

## 2022-12-10 LAB — LIPID PANEL
Cholesterol: 194 mg/dL (ref 0–200)
HDL: 43 mg/dL (ref >39.00)
LDL Cholesterol: 137 mg/dL — ABNORMAL HIGH (ref 0–99)
NonHDL: 150.61
Total CHOL/HDL Ratio: 5
Triglycerides: 67 mg/dL (ref 0.0–149.0)
VLDL: 13.4 mg/dL (ref 0.0–40.0)

## 2022-12-10 LAB — COMPREHENSIVE METABOLIC PANEL
ALT: 22 U/L (ref 0–53)
AST: 23 U/L (ref 0–37)
Albumin: 4.7 g/dL (ref 3.5–5.2)
Alkaline Phosphatase: 66 U/L (ref 39–117)
BUN: 12 mg/dL (ref 6–23)
CO2: 31 mEq/L (ref 19–32)
Calcium: 9.8 mg/dL (ref 8.4–10.5)
Chloride: 103 mEq/L (ref 96–112)
Creatinine, Ser: 1.05 mg/dL (ref 0.40–1.50)
GFR: 85.87 mL/min (ref >60.00)
Glucose, Bld: 67 mg/dL — ABNORMAL LOW (ref 70–99)
Potassium: 3.9 mEq/L (ref 3.5–5.1)
Sodium: 141 mEq/L (ref 135–145)
Total Bilirubin: 0.6 mg/dL (ref 0.2–1.2)
Total Protein: 7.4 g/dL (ref 6.0–8.3)

## 2022-12-10 LAB — PSA: PSA: 1.33 ng/mL (ref 0.10–4.00)

## 2022-12-18 ENCOUNTER — Other Ambulatory Visit (HOSPITAL_COMMUNITY): Payer: Self-pay

## 2023-12-10 ENCOUNTER — Encounter: Admitting: Medical

## 2023-12-13 ENCOUNTER — Encounter: Payer: Self-pay | Admitting: Medical

## 2023-12-13 ENCOUNTER — Ambulatory Visit (INDEPENDENT_AMBULATORY_CARE_PROVIDER_SITE_OTHER): Admitting: Medical

## 2023-12-13 ENCOUNTER — Encounter: Payer: Self-pay | Admitting: Gastroenterology

## 2023-12-13 VITALS — BP 118/80 | HR 77 | Resp 16 | Ht 73.0 in | Wt 189.6 lb

## 2023-12-13 DIAGNOSIS — Z1211 Encounter for screening for malignant neoplasm of colon: Secondary | ICD-10-CM

## 2023-12-13 DIAGNOSIS — Z23 Encounter for immunization: Secondary | ICD-10-CM | POA: Diagnosis not present

## 2023-12-13 DIAGNOSIS — Z0000 Encounter for general adult medical examination without abnormal findings: Secondary | ICD-10-CM | POA: Diagnosis not present

## 2023-12-13 DIAGNOSIS — Z125 Encounter for screening for malignant neoplasm of prostate: Secondary | ICD-10-CM

## 2023-12-13 LAB — COMPREHENSIVE METABOLIC PANEL WITH GFR
ALT: 18 U/L (ref 0–53)
AST: 17 U/L (ref 0–37)
Albumin: 4.4 g/dL (ref 3.5–5.2)
Alkaline Phosphatase: 54 U/L (ref 39–117)
BUN: 8 mg/dL (ref 6–23)
CO2: 30 meq/L (ref 19–32)
Calcium: 9.1 mg/dL (ref 8.4–10.5)
Chloride: 106 meq/L (ref 96–112)
Creatinine, Ser: 1.04 mg/dL (ref 0.40–1.50)
GFR: 86.25 mL/min (ref >60.00)
Glucose, Bld: 80 mg/dL (ref 70–99)
Potassium: 3.8 meq/L (ref 3.5–5.1)
Sodium: 143 meq/L (ref 135–145)
Total Bilirubin: 0.5 mg/dL (ref 0.2–1.2)
Total Protein: 6.7 g/dL (ref 6.0–8.3)

## 2023-12-13 LAB — CBC WITH DIFFERENTIAL/PLATELET
Basophils Absolute: 0 K/uL (ref 0.0–0.1)
Basophils Relative: 0.9 % (ref 0.0–3.0)
Eosinophils Absolute: 0.1 K/uL (ref 0.0–0.7)
Eosinophils Relative: 1.9 % (ref 0.0–5.0)
HCT: 46.2 % (ref 39.0–52.0)
Hemoglobin: 15.3 g/dL (ref 13.0–17.0)
Lymphocytes Relative: 44.6 % (ref 12.0–46.0)
Lymphs Abs: 2.2 K/uL (ref 0.7–4.0)
MCHC: 33.1 g/dL (ref 30.0–36.0)
MCV: 77.9 fl — ABNORMAL LOW (ref 78.0–100.0)
Monocytes Absolute: 0.5 K/uL (ref 0.1–1.0)
Monocytes Relative: 10.6 % (ref 3.0–12.0)
Neutro Abs: 2 K/uL (ref 1.4–7.7)
Neutrophils Relative %: 42 % — ABNORMAL LOW (ref 43.0–77.0)
Platelets: 225 K/uL (ref 150.0–400.0)
RBC: 5.94 Mil/uL — ABNORMAL HIGH (ref 4.22–5.81)
RDW: 13.5 % (ref 11.5–15.5)
WBC: 4.8 K/uL (ref 4.0–10.5)

## 2023-12-13 LAB — LIPID PANEL
Cholesterol: 171 mg/dL (ref 0–200)
HDL: 38.9 mg/dL — ABNORMAL LOW (ref >39.00)
LDL Cholesterol: 122 mg/dL — ABNORMAL HIGH (ref 0–99)
NonHDL: 131.74
Total CHOL/HDL Ratio: 4
Triglycerides: 49 mg/dL (ref 0.0–149.0)
VLDL: 9.8 mg/dL (ref 0.0–40.0)

## 2023-12-13 NOTE — Addendum Note (Signed)
 Addended by: GERARD CHUCKIE SAILOR on: 12/13/2023 09:52 AM   Modules accepted: Orders

## 2023-12-13 NOTE — Patient Instructions (Addendum)
 For you wellness exam today I have ordered cbc, cmp, psa and lipid panel  Discussed hep b vaccine. Pt seeing ID for chronic hep b and stable. Hep b vaccine not indicated. Will get pcv 20 vaccine.  Referral to GI MD for colonoscopy. Please call them for appointment.  Recommend exercise and healthy diet.  We will let you know lab results as they come in.  Follow up date appointment will be determined after lab review.    Preventive Care 69-46 Years Old, Male Preventive care refers to lifestyle choices and visits with your health care provider that can promote health and wellness. Preventive care visits are also called wellness exams. What can I expect for my preventive care visit? Counseling During your preventive care visit, your health care provider may ask about your: Medical history, including: Past medical problems. Family medical history. Current health, including: Emotional well-being. Home life and relationship well-being. Sexual activity. Lifestyle, including: Alcohol, nicotine or tobacco, and drug use. Access to firearms. Diet, exercise, and sleep habits. Safety issues such as seatbelt and bike helmet use. Sunscreen use. Work and work Astronomer. Physical exam Your health care provider will check your: Height and weight. These may be used to calculate your BMI (body mass index). BMI is a measurement that tells if you are at a healthy weight. Waist circumference. This measures the distance around your waistline. This measurement also tells if you are at a healthy weight and may help predict your risk of certain diseases, such as type 2 diabetes and high blood pressure. Heart rate and blood pressure. Body temperature. Skin for abnormal spots. What immunizations do I need?  Vaccines are usually given at various ages, according to a schedule. Your health care provider will recommend vaccines for you based on your age, medical history, and lifestyle or other factors, such  as travel or where you work. What tests do I need? Screening Your health care provider may recommend screening tests for certain conditions. This may include: Lipid and cholesterol levels. Diabetes screening. This is done by checking your blood sugar (glucose) after you have not eaten for a while (fasting). Hepatitis B test. Hepatitis C test. HIV (human immunodeficiency virus) test. STI (sexually transmitted infection) testing, if you are at risk. Lung cancer screening. Prostate cancer screening. Colorectal cancer screening. Talk with your health care provider about your test results, treatment options, and if necessary, the need for more tests. Follow these instructions at home: Eating and drinking  Eat a diet that includes fresh fruits and vegetables, whole grains, lean protein, and low-fat dairy products. Take vitamin and mineral supplements as recommended by your health care provider. Do not drink alcohol if your health care provider tells you not to drink. If you drink alcohol: Limit how much you have to 0-2 drinks a day. Know how much alcohol is in your drink. In the [place] one drink equals one 12 oz bottle of beer (355 mL), one 5 oz glass of wine (148 mL), or one 1 oz glass of hard liquor (44 mL). Lifestyle Brush your teeth every morning and night with fluoride toothpaste. Floss one time each day. Exercise for at least 30 minutes 5 or more days each week. Do not use any products that contain nicotine or tobacco. These products include cigarettes, chewing tobacco, and vaping devices, such as e-cigarettes. If you need help quitting, ask your health care provider. Do not use drugs. If you are sexually active, practice safe sex. Use a condom or other form of  protection to prevent STIs. Take aspirin only as told by your health care provider. Make sure that you understand how much to take and what form to take. Work with your health care provider to find out whether it is safe and  beneficial for you to take aspirin daily. Find healthy ways to manage stress, such as: Meditation, yoga, or listening to music. Journaling. Talking to a trusted person. Spending time with friends and family. Minimize exposure to UV radiation to reduce your risk of skin cancer. Safety Always wear your seat belt while driving or riding in a vehicle. Do not drive: If you have been drinking alcohol. Do not ride with someone who has been drinking. When you are tired or distracted. While texting. If you have been using any mind-altering substances or drugs. Wear a helmet and other protective equipment during sports activities. If you have firearms in your house, make sure you follow all gun safety procedures. What's next? Go to your health care provider once a year for an annual wellness visit. Ask your health care provider how often you should have your eyes and teeth checked. Stay up to date on all vaccines. This information is not intended to replace advice given to you by your health care provider. Make sure you discuss any questions you have with your health care provider. Document Revised: 10/30/2020 Document Reviewed: 10/30/2020 Elsevier Patient Education  2024 [employer].

## 2023-12-13 NOTE — Progress Notes (Signed)
 Subjective:    Patient ID: Jesse Moreno, male    DOB: 1975-05-20, 48 y.o.   MRN: [id number]  HPI  Pt in for wellness exam. I saw him last year around this time for wellness. Pt is fasting.  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  Pt exercises regularly. He jogs 3-4 miles about 1-2 times a week. He states he eats healthy. No caffeinated beverages. Marriage. 3 children.    Pt states he did not get colonoscopy. I had placed referral last year.    Review of Systems  Constitutional:  Negative for appetite change, diaphoresis and fatigue.  HENT:  Negative for congestion, ear pain and facial swelling.   Respiratory:  Negative for cough, chest tightness, shortness of breath and wheezing.   Cardiovascular:  Negative for chest pain and palpitations.  Gastrointestinal:  Negative for abdominal pain, constipation, nausea and vomiting.  Musculoskeletal:  Negative for back pain, joint swelling and neck pain.  Skin:  Negative for rash.  Neurological:  Negative for dizziness, seizures and light-headedness.  Hematological:  Negative for adenopathy. Does not bruise/bleed easily.  Psychiatric/Behavioral:  Negative for behavioral problems, decreased concentration and hallucinations. The patient is not nervous/anxious.     Past Medical History:  Diagnosis Date   Hepatitis B      Social History   Socioeconomic History   Marital status: Married    Spouse name: Not on file   Number of children: Not on file   Years of education: Not on file   Highest education level: Not on file  Occupational History   Not on file  Tobacco Use   Smoking status: Never   Smokeless tobacco: Never  Substance and Sexual Activity   Alcohol use: No   Drug use: No   Sexual activity: Yes    Comment: accepted condoms  Other Topics Concern   Not on file  Social History Narrative   Not on file   Social Drivers of Health   Financial Resource Strain: Not on file  Food Insecurity: Not on file  Transportation  Needs: Not on file  Physical Activity: Not on file  Stress: Not on file  Social Connections: Not on file  Intimate Partner Violence: Not on file    No past surgical history on file.  Family History  Problem Relation Age of Onset   Diabetes Mother    Hypertension Mother    Diabetes Father    Hypertension Father     Allergies  Allergen Reactions   Ampicillin    Aspirin    Vancomycin     Current Outpatient Medications on File Prior to Visit  Medication Sig Dispense Refill   traMADol  (ULTRAM ) 50 MG tablet Take 1 tablet (50 mg total) by mouth every 6 (six) hours as needed (pain). (Patient not taking: Reported on 12/13/2023) 12 tablet 0   No current facility-administered medications on file prior to visit.    BP 118/80   Pulse 77   Resp 16   Ht 6' 1 (1.854 m)   Wt 189 lb 9.6 oz (86 kg)   SpO2 93%   BMI 25.01 kg/m        Objective:   Physical Exam  General Mental Status- Alert. General Appearance- Not in acute distress.   Skin General: Color- Normal Color. Moisture- Normal Moisture.  Neck No JVD.  Chest and Lung Exam Auscultation: Breath Sounds:-Normal.  Cardiovascular Auscultation:Rythm- Regular. Murmurs & Other Heart Sounds:Auscultation of the heart reveals- No Murmurs.  Abdomen Inspection:-Inspeection Normal.  Palpation/Percussion:Note:No mass. Palpation and Percussion of the abdomen reveal- Non Tender, Non Distended + BS, no rebound or guarding.   Neurologic Cranial Nerve exam:- CN III-XII intact(No nystagmus), symmetric smile. Strength:- 5/5 equal and symmetric strength both upper and lower extremities.       Assessment & Plan:   Patient Instructions  For you wellness exam today I have ordered cbc, cmp, psa and lipid panel  Discussed hep b vaccine. Pt seeing ID for chronic hep b and stable. Hep b vaccine not indicated. Will get pcv 20 vaccine.  Referral to GI MD for colonoscopy. Please call them for appointment.  Recommend exercise and  healthy diet.  We will let you know lab results as they come in.  Follow up date appointment will be determined after lab review.       Ferdie Bakken, PA-C

## 2023-12-14 LAB — PSA: PSA: 0.9 ng/mL (ref 0.10–4.00)

## 2023-12-17 ENCOUNTER — Ambulatory Visit: Payer: Self-pay | Admitting: Medical

## 2023-12-22 ENCOUNTER — Other Ambulatory Visit (HOSPITAL_COMMUNITY): Payer: Self-pay

## 2023-12-22 DIAGNOSIS — N5201 Erectile dysfunction due to arterial insufficiency: Secondary | ICD-10-CM | POA: Diagnosis not present

## 2023-12-22 MED ORDER — SILDENAFIL CITRATE 100 MG PO TABS
100.0000 mg | ORAL_TABLET | Freq: Every day | ORAL | 11 refills | Status: AC
  Filled 2023-12-22: qty 6, 30d supply, fill #0
  Filled 2024-01-19: qty 6, 30d supply, fill #1
  Filled 2024-04-24: qty 6, 30d supply, fill #2
  Filled 2024-04-28: qty 10, 10d supply, fill #3

## 2024-01-13 ENCOUNTER — Other Ambulatory Visit (HOSPITAL_COMMUNITY): Payer: Self-pay

## 2024-01-13 ENCOUNTER — Ambulatory Visit (AMBULATORY_SURGERY_CENTER)

## 2024-01-13 VITALS — Ht 73.0 in | Wt 189.0 lb

## 2024-01-13 DIAGNOSIS — Z1211 Encounter for screening for malignant neoplasm of colon: Secondary | ICD-10-CM

## 2024-01-13 MED ORDER — NA SULFATE-K SULFATE-MG SULF 17.5-3.13-1.6 GM/177ML PO SOLN
1.0000 | Freq: Once | ORAL | 0 refills | Status: AC
Start: 2024-01-13 — End: 2024-01-15
  Filled 2024-01-13: qty 354, 1d supply, fill #0

## 2024-01-13 NOTE — Progress Notes (Signed)
 No egg or soy allergy known to patient  No issues known to pt with past sedation with any surgeries or procedures Patient denies ever being told they had issues or difficulty with intubation - Denies surgeries No FH of Malignant Hyperthermia Pt is not on diet pills Pt is not on  home 02  Pt is not on blood thinners  Pt denies issues with constipation  No A fib or A flutter Have any cardiac testing pending--No Pt can ambulate  Pt denies use of chewing tobacco Discussed diabetic I weight loss medication holds Discussed NSAID holds Checked BMI Pt instructed to use [employer] or GoodRx for a price reduction on prep  Patient's chart reviewed by Norleen Schillings CNRA prior to previsit and patient appropriate for the [facility].  Pre visit completed and red dot placed by patient's name on their procedure day (on provider's schedule).

## 2024-01-14 ENCOUNTER — Ambulatory Visit (HOSPITAL_BASED_OUTPATIENT_CLINIC_OR_DEPARTMENT_OTHER)
Admission: RE | Admit: 2024-01-14 | Discharge: 2024-01-14 | Disposition: A | Discharge status: Home / Self Care | Source: Ambulatory Visit | Attending: Medical | Admitting: Medical

## 2024-01-14 ENCOUNTER — Other Ambulatory Visit (HOSPITAL_COMMUNITY): Payer: Self-pay

## 2024-01-14 ENCOUNTER — Encounter: Payer: Self-pay | Admitting: Gastroenterology

## 2024-01-14 ENCOUNTER — Ambulatory Visit: Admitting: Medical

## 2024-01-14 VITALS — BP 122/86 | HR 64 | Temp 97.9°F | Resp 15 | Ht 73.0 in | Wt 184.2 lb

## 2024-01-14 DIAGNOSIS — M47816 Spondylosis without myelopathy or radiculopathy, lumbar region: Secondary | ICD-10-CM | POA: Diagnosis not present

## 2024-01-14 DIAGNOSIS — N529 Male erectile dysfunction, unspecified: Secondary | ICD-10-CM

## 2024-01-14 DIAGNOSIS — M545 Low back pain, unspecified: Secondary | ICD-10-CM | POA: Insufficient documentation

## 2024-01-14 DIAGNOSIS — M5136 Other intervertebral disc degeneration, lumbar region with discogenic back pain only: Secondary | ICD-10-CM | POA: Diagnosis not present

## 2024-01-14 MED ORDER — CYCLOBENZAPRINE HCL 10 MG PO TABS
10.0000 mg | ORAL_TABLET | Freq: Every evening | ORAL | 0 refills | Status: AC | PRN
  Filled 2024-01-14: qty 3, 3d supply, fill #0

## 2024-01-14 NOTE — Patient Instructions (Signed)
 Low back pain Chronic intermittent low back pain with acute exacerbation. Pain localized to lower back, no leg radiation. Muscle spasms present. No prior x-ray imaging. - Order lumbar x-ray to evaluate anatomical cause. - Prescribe cyclobenzaprine  5 mg at night for up to three nights as needed for muscle spasms. - Advise low-dose ibuprofen (200-400 mg) with acetaminophen for pain, caution with hypertension. - Provide back stretching and exercise instructions. - Send prescriptions to [facility]. - Follow up on x-ray results and pain level by Tuesday. - Advise to report worsening pain or new symptoms for potential further imaging or referral.  ED(mild) -Try viagra  as urologist rx'd -let me know if helped.  Follow up date to be determined based on xray results and my chart update early next week  Back Exercises The following exercises strengthen the muscles that help to support the trunk (torso) and back. They also help to keep the lower back flexible. Doing these exercises can help to prevent or lessen existing low back pain. If you have back pain or discomfort, try doing these exercises 2-3 times each day or as told by your health care provider. As your pain improves, do them once each day, but increase the number of times that you repeat the steps for each exercise (do more repetitions). To prevent the recurrence of back pain, continue to do these exercises once each day or as told by your health care provider. Do exercises exactly as told by your health care provider and adjust them as directed. It is normal to feel mild stretching, pulling, tightness, or discomfort as you do these exercises, but you should stop right away if you feel sudden pain or your pain gets worse. Exercises Single knee to chest Repeat these steps 3-5 times for each leg: Lie on your back on a firm bed or the floor with your legs extended. Bring one knee to your chest. Your other leg should stay  extended and in contact with the floor. Hold your knee in place by grabbing your knee or thigh with both hands and hold. Pull on your knee until you feel a gentle stretch in your lower back or buttocks. Hold the stretch for 10-30 seconds. Slowly release and straighten your leg.  Pelvic tilt Repeat these steps 5-10 times: Lie on your back on a firm bed or the floor with your legs extended. Bend your knees so they are pointing toward the ceiling and your feet are flat on the floor. Tighten your lower abdominal muscles to press your lower back against the floor. This motion will tilt your pelvis so your tailbone points up toward the ceiling instead of pointing to your feet or the floor. With gentle tension and even breathing, hold this position for 5-10 seconds.  Cat-cow Repeat these steps until your lower back becomes more flexible: Get into a hands-and-knees position on a firm bed or the floor. Keep your hands under your shoulders, and keep your knees under your hips. You may place padding under your knees for comfort. Let your head hang down toward your chest. Contract your abdominal muscles and point your tailbone toward the floor so your lower back becomes rounded like the back of a cat. Hold this position for 5 seconds. Slowly lift your head, let your abdominal muscles relax, and point your tailbone up toward the ceiling so your back forms a sagging arch like the back of a cow. Hold this position for 5 seconds.  Press-ups Repeat these steps 5-10 times: Anselmo  on your abdomen (face-down) on a firm bed or the floor. Place your palms near your head, about shoulder-width apart. Keeping your back as relaxed as possible and keeping your hips on the floor, slowly straighten your arms to raise the top half of your body and lift your shoulders. Do not use your back muscles to raise your upper torso. You may adjust the placement of your hands to make yourself more comfortable. Hold this position for  5 seconds while you keep your back relaxed. Slowly return to lying flat on the floor.  Bridges Repeat these steps 10 times: Lie on your back on a firm bed or the floor. Bend your knees so they are pointing toward the ceiling and your feet are flat on the floor. Your arms should be flat at your sides, next to your body. Tighten your buttocks muscles and lift your buttocks off the floor until your waist is at almost the same height as your knees. You should feel the muscles working in your buttocks and the back of your thighs. If you do not feel these muscles, slide your feet 1-2 inches (2.5-5 cm) farther away from your buttocks. Hold this position for 3-5 seconds. Slowly lower your hips to the starting position, and allow your buttocks muscles to relax completely. If this exercise is too easy, try doing it with your arms crossed over your chest. Abdominal crunches Repeat these steps 5-10 times: Lie on your back on a firm bed or the floor with your legs extended. Bend your knees so they are pointing toward the ceiling and your feet are flat on the floor. Cross your arms over your chest. Tip your chin slightly toward your chest without bending your neck. Tighten your abdominal muscles and slowly raise your torso high enough to lift your shoulder blades a tiny bit off the floor. Avoid raising your torso higher than that because it can put too much stress on your lower back and does not help to strengthen your abdominal muscles. Slowly return to your starting position.  Back lifts Repeat these steps 5-10 times: Lie on your abdomen (face-down) with your arms at your sides, and rest your forehead on the floor. Tighten the muscles in your legs and your buttocks. Slowly lift your chest off the floor while you keep your hips pressed to the floor. Keep the back of your head in line with the curve in your back. Your eyes should be looking at the floor. Hold this position for 3-5 seconds. Slowly return  to your starting position.  Contact a health care provider if: Your back pain or discomfort gets much worse when you do an exercise. Your worsening back pain or discomfort does not lessen within 2 hours after you exercise. If you have any of these problems, stop doing these exercises right away. Do not do them again unless your health care provider says that you can. Get help right away if: You develop sudden, severe back pain. If this happens, stop doing the exercises right away. Do not do them again unless your health care provider says that you can. This information is not intended to replace advice given to you by your health care provider. Make sure you discuss any questions you have with your health care provider. Document Revised: 06/07/2022 Document Reviewed: 07/17/2020 Elsevier Patient Education  2024 [employer].

## 2024-01-14 NOTE — Progress Notes (Signed)
 Subjective:    Patient ID: Jesse Moreno, male    DOB: 12-05-1975, 48 y.o.   MRN: [id number]  HPI  Jesse Moreno is a 48 year old male who presents with worsening lower back pain.  He has experienced intermittent lower back pain since his thirties, initially occurring once or twice a year and resolving without treatment. Over the past year, the pain has increased in frequency and duration, sometimes lasting up to two weeks. Currently, the pain is sharp and localized to the lower back, without radiation to the legs. The pain has been present intermittently for the past two weeks, with episodes of relief in between.  He experiences itching sensations in his nerves and occasional muscle spasms in the back. He has not been using any specific medication for the pain but takes Tylenol occasionally, which provides some relief. He avoids ibuprofen due to concerns about blood pressure.  He mentions a decrease in sex drive and has recently started using Viagra  for occasional erectile dysfunction, though he is usually able to maintain an erection without treatment.  No pain radiating to the legs. Occasional muscle spasms in the back.   Review of Systems  Constitutional:  Negative for chills, fatigue and fever.  Respiratory:  Negative for choking, shortness of breath and wheezing.   Cardiovascular:  Negative for chest pain and palpitations.  Gastrointestinal:  Negative for abdominal pain.  Genitourinary:  Negative for dysuria, frequency, penile pain and urgency.  Musculoskeletal:  Positive for back pain.  Skin:  Negative for rash.  Hematological:  Negative for adenopathy.  Psychiatric/Behavioral:  Negative for behavioral problems, decreased concentration and hallucinations.    Past Medical History:  Diagnosis Date   Hepatitis B      Social History   Socioeconomic History   Marital status: Married    Spouse name: Not on file   Number of children: Not on file   Years of education: Not on  file   Highest education level: Not on file  Occupational History   Not on file  Tobacco Use   Smoking status: Never   Smokeless tobacco: Never  Vaping Use   Vaping status: Never Used  Substance and Sexual Activity   Alcohol use: No   Drug use: No   Sexual activity: Yes    Comment: accepted condoms  Other Topics Concern   Not on file  Social History Narrative   Not on file   Social Drivers of Health   Financial Resource Strain: Not on file  Food Insecurity: Not on file  Transportation Needs: Not on file  Physical Activity: Not on file  Stress: Not on file  Social Connections: Not on file  Intimate Partner Violence: Not on file    Past Surgical History:  Procedure Laterality Date   NO PAST SURGERIES      Family History  Problem Relation Age of Onset   Diabetes Mother    Hypertension Mother    Diabetes Father    Hypertension Father    Colon cancer Neg Hx    Rectal cancer Neg Hx    Stomach cancer Neg Hx    Esophageal cancer Neg Hx     Allergies  Allergen Reactions   Aspirin Rash   Vancomycin Swelling    feet   Ampicillin Rash    Current Outpatient Medications on File Prior to Visit  Medication Sig Dispense Refill   Na Sulfate-K Sulfate-Mg Sulfate concentrate (SUPREP) 17.5-3.13-1.6 GM/177ML SOLN Take 1 kit (354 mLs total) by mouth  once for 1 dose. (Patient not taking: Reported on 01/14/2024) 354 mL 0   sildenafil  (VIAGRA ) 100 MG tablet Take 1 tablet (100 mg total) by mouth daily. (Patient not taking: Reported on 01/14/2024) 30 tablet 11   traMADol  (ULTRAM ) 50 MG tablet Take 1 tablet (50 mg total) by mouth every 6 (six) hours as needed (pain). (Patient not taking: Reported on 12/13/2023) 12 tablet 0   No current facility-administered medications on file prior to visit.    BP 122/86   Pulse 64   Temp 97.9 F (36.6 C) (Oral)   Resp 15   Ht 6' 1 (1.854 m)   Wt 184 lb 3.2 oz (83.6 kg)   SpO2 99%   BMI 24.30 kg/m        Objective:   Physical  Exam  General Appearance- Not in acute distress.    Chest and Lung Exam Auscultation: Breath sounds:-Normal. Clear even and unlabored. Adventitious sounds:- No Adventitious sounds.  Cardiovascular Auscultation:Rythm - Regular, rate and rythm. Heart Sounds -Normal heart sounds.  Abdomen Inspection:-Inspection Normal.  Palpation/Perucssion: Palpation and Percussion of the abdomen reveal- Non Tender, No Rebound tenderness, No rigidity(Guarding) and No Palpable abdominal masses.  Liver:-Normal.  Spleen:- Normal.   Back Mild lumbar spine tenderness to palpation. Pain on straight leg lift. Pain on lateral movements and flexion/extension of the spine.  Lower ext neurologic  L5-S1 sensation intact bilaterally. Normal patellar reflexes bilaterally. No foot drop bilaterally.       Assessment & Plan:   Low back pain Chronic intermittent low back pain with acute exacerbation. Pain localized to lower back, no leg radiation. Muscle spasms present. No prior x-ray imaging. - Order lumbar x-ray to evaluate anatomical cause. - Prescribe cyclobenzaprine  5 mg at night for up to three nights as needed for muscle spasms. - Advise low-dose ibuprofen (200-400 mg) with acetaminophen for pain, caution with hypertension. - Provide back stretching and exercise instructions. - Send prescriptions to [facility]. - Follow up on x-ray results and pain level by Tuesday. - Advise to report worsening pain or new symptoms for potential further imaging or referral.  ED(mild) -Try viagra  as urologist rx'd -let me know if helped.  Follow up date to be determined based on xray results and my chart update early next week  Donneisha Beane, PA-C

## 2024-01-15 ENCOUNTER — Ambulatory Visit: Payer: Self-pay | Admitting: Medical

## 2024-01-24 ENCOUNTER — Encounter: Payer: Self-pay | Admitting: Gastroenterology

## 2024-01-24 ENCOUNTER — Ambulatory Visit: Admitting: Gastroenterology

## 2024-01-24 VITALS — BP 120/75 | HR 72 | Temp 98.3°F | Resp 16 | Ht 73.0 in | Wt 189.0 lb

## 2024-01-24 DIAGNOSIS — K648 Other hemorrhoids: Secondary | ICD-10-CM | POA: Diagnosis not present

## 2024-01-24 DIAGNOSIS — Q438 Other specified congenital malformations of intestine: Secondary | ICD-10-CM

## 2024-01-24 DIAGNOSIS — Z1211 Encounter for screening for malignant neoplasm of colon: Secondary | ICD-10-CM | POA: Diagnosis not present

## 2024-01-24 MED ORDER — SODIUM CHLORIDE 0.9 % IV SOLN: 500.0000 mL | Freq: Once | INTRAVENOUS | Status: AC

## 2024-01-24 NOTE — Op Note (Signed)
 [facility] Patient Name: Jesse Moreno Procedure Date: 01/24/2024 10:45 AM MRN: [id number] Endoscopist: Victory L. Legrand , MD, [id number] Age: 48 Referring MD:  Date of Birth: 1976-04-21 Gender: Male Account #: [id number] Procedure:                Colonoscopy Indications:              Screening for colorectal malignant neoplasm, This                            is the patient's first colonoscopy Medicines:                Monitored Anesthesia Care Procedure:                Pre-Anesthesia Assessment:                           - Prior to the procedure, a History and Physical                            was performed, and patient medications and                            allergies were reviewed. The patient's tolerance of                            previous anesthesia was also reviewed. The risks                            and benefits of the procedure and the sedation                            options and risks were discussed with the patient.                            All questions were answered, and informed consent                            was obtained. Prior Anticoagulants: The patient has                            taken no anticoagulant or antiplatelet agents. ASA                            Grade Assessment: II - A patient with mild systemic                            disease. After reviewing the risks and benefits,                            the patient was deemed in satisfactory condition to                            undergo the procedure.  After obtaining informed consent, the colonoscope                            was passed under direct vision. Throughout the                            procedure, the patient's blood pressure, pulse, and                            oxygen saturations were monitored continuously. The                            CF [id number] #[id number] was introduced through the anus                            and advanced to the the  cecum, identified by                            appendiceal orifice and ileocecal valve. The                            colonoscopy was performed with difficulty due to a                            redundant colon. Successful completion of the                            procedure was aided by changing the patient to a                            semi-prone position, using manual pressure and                            straightening and shortening the scope to obtain                            bowel loop reduction. The patient tolerated the                            procedure well. The quality of the bowel                            preparation was excellent. The ileocecal valve,                            appendiceal orifice, and rectum were photographed.                            The bowel preparation used was SUPREP. Scope In: 11:00:57 AM Scope Out: 11:25:53 AM Scope Withdrawal Time: 0 hours 13 minutes 13 seconds  Total Procedure Duration: 0 hours 24 minutes 56 seconds  Findings:                 The perianal and digital rectal examinations  were                            normal.                           Repeat examination of right colon under NBI                            performed.                           The colon (entire examined portion) was redundant.                           Internal hemorrhoids were found. The hemorrhoids                            were small.                           The exam was otherwise without abnormality on                            direct and retroflexion views. Complications:            No immediate complications. Estimated Blood Loss:     Estimated blood loss: none. Impression:               - Redundant colon.                           - Internal hemorrhoids.                           - The examination was otherwise normal on direct                            and retroflexion views.                           - No specimens  collected. Recommendation:           - Patient has a contact number available for                            emergencies. The signs and symptoms of potential                            delayed complications were discussed with the                            patient. Return to normal activities tomorrow.                            Written discharge instructions were provided to the                            patient.                           -  Resume previous diet.                           - Continue present medications.                           - Repeat colonoscopy in 10 years for screening                            purposes. Rey Dansby L. Legrand, MD 01/24/2024 11:31:11 AM This report has been signed electronically.

## 2024-01-24 NOTE — Patient Instructions (Signed)
 YOU HAD AN ENDOSCOPIC PROCEDURE TODAY AT THE [facility]:   Refer to the procedure report that was given to you for any specific questions about what was found during the examination.  If the procedure report does not answer your questions, please call your gastroenterologist to clarify.  If you requested that your care partner not be given the details of your procedure findings, then the procedure report has been included in a sealed envelope for you to review at your convenience later.  YOU SHOULD EXPECT: Some feelings of bloating in the abdomen. Passage of more gas than usual.  Walking can help get rid of the air that was put into your GI tract during the procedure and reduce the bloating. If you had a lower endoscopy (such as a colonoscopy or flexible sigmoidoscopy) you may notice spotting of blood in your stool or on the toilet paper. If you underwent a bowel prep for your procedure, you may not have a normal bowel movement for a few days.  Please Note:  You might notice some irritation and congestion in your nose or some drainage.  This is from the oxygen used during your procedure.  There is no need for concern and it should clear up in a day or so.  SYMPTOMS TO REPORT IMMEDIATELY:  Following lower endoscopy (colonoscopy or flexible sigmoidoscopy):  Excessive amounts of blood in the stool  Significant tenderness or worsening of abdominal pains  Swelling of the abdomen that is new, acute  Fever of 100F or higher   For urgent or emergent issues, a gastroenterologist can be reached at any hour by calling [phone]. Do not use MyChart messaging for urgent concerns.    DIET:  We do recommend a small meal at first, but then you may proceed to your regular diet.  Drink plenty of fluids but you should avoid alcoholic beverages for 24 hours.  MEDICATIONS: Continue present medications.  FOLLOW UP: Repeat colonoscopy in 10 years for surveillance purposes.  Handouts given to  patient: Hemorrhoids.  Thank you for allowing us  to provide for your healthcare needs today.  ACTIVITY:  You should plan to take it easy for the rest of today and you should NOT DRIVE or use heavy machinery until tomorrow (because of the sedation medicines used during the test).    FOLLOW UP: Our staff will call the number listed on your records the next business day following your procedure.  We will call around 7:15- 8:00 am to check on you and address any questions or concerns that you may have regarding the information given to you following your procedure. If we do not reach you, we will leave a message.     If any biopsies were taken you will be contacted by phone or by letter within the next 1-3 weeks.  Please call us  at [phone] if you have not heard about the biopsies in 3 weeks.    SIGNATURES/CONFIDENTIALITY: You and/or your care partner have signed paperwork which will be entered into your electronic medical record.  These signatures attest to the fact that that the information above on your After Visit Summary has been reviewed and is understood.  Full responsibility of the confidentiality of this discharge information lies with you and/or your care-partner.

## 2024-01-24 NOTE — Progress Notes (Signed)
 Vss nad trans to pacu

## 2024-01-24 NOTE — Progress Notes (Signed)
 History and Physical:  This patient presents for endoscopic testing for: Encounter Diagnosis  Name Primary?   Special screening for malignant neoplasms, colon Yes    Average risk for colorectal cancer.  1st screening exam.  Patient denies chronic abdominal pain, rectal bleeding, constipation or diarrhea.   Patient is otherwise without complaints or active issues today.   Past Medical History: Past Medical History:  Diagnosis Date   Hepatitis B      Past Surgical History: Past Surgical History:  Procedure Laterality Date   NO PAST SURGERIES      Allergies: Allergies  Allergen Reactions   Aspirin Rash   Vancomycin Swelling    feet   Ampicillin Rash    Outpatient Meds: Current Outpatient Medications  Medication Sig Dispense Refill   cyclobenzaprine  (FLEXERIL ) 10 MG tablet Take 1 tablet (10 mg total) by mouth at bedtime as needed for back spasms. 3 tablet 0   sildenafil  (VIAGRA ) 100 MG tablet Take 1 tablet (100 mg total) by mouth daily. (Patient not taking: No sig reported) 30 tablet 11   traMADol  (ULTRAM ) 50 MG tablet Take 1 tablet (50 mg total) by mouth every 6 (six) hours as needed (pain). (Patient not taking: No sig reported) 12 tablet 0   Current Facility-Administered Medications  Medication Dose Route Frequency Provider Last Rate Last Admin   0.9 %  sodium chloride  infusion  500 mL Intravenous Once Danis, Rennie Hack L III, MD          ___________________________________________________________________ Objective   Exam:  BP (!) 142/88   Pulse 72   Temp 98.3 F (36.8 C)   Ht 6' 1 (1.854 m)   Wt 189 lb (85.7 kg)   SpO2 98%   BMI 24.94 kg/m   CV: regular , S1/S2 Resp: clear to auscultation bilaterally, normal RR and effort noted GI: soft, no tenderness, with active bowel sounds.   Assessment: Encounter Diagnosis  Name Primary?   Special screening for malignant neoplasms, colon Yes     Plan: Colonoscopy   The benefits and risks of the planned  procedure(s) were described in detail with the patient or (when appropriate) their health care proxy.  Risks were outlined as including, but not limited to, bleeding, infection, perforation, adverse medication reaction leading to cardiac or pulmonary decompensation, pancreatitis (if ERCP).  The limitation of incomplete mucosal visualization was also discussed.  No guarantees or warranties were given.  The patient is appropriate for an endoscopic procedure in the ambulatory setting.   - Victory Brand, MD

## 2024-01-24 NOTE — Progress Notes (Signed)
 Pt's states no medical or surgical changes since previsit or office visit.

## 2024-01-25 ENCOUNTER — Telehealth: Payer: Self-pay

## 2024-01-25 NOTE — Telephone Encounter (Signed)
 No answer, left message to call if having any issues or concerns, B.Vester Titsworth RN

## 2024-04-28 ENCOUNTER — Other Ambulatory Visit (HOSPITAL_COMMUNITY): Payer: Self-pay
